# Patient Record
Sex: Male | Born: 1965 | Hispanic: Yes | Marital: Married | Smoking: Never smoker
Health system: Southern US, Community
[De-identification: ages and names within clinical notes are randomized; demographics above are authoritative.]

---

## 2018-12-22 ENCOUNTER — Other Ambulatory Visit: Payer: Self-pay

## 2018-12-22 ENCOUNTER — Encounter: Payer: Self-pay | Admitting: Emergency Medicine

## 2018-12-22 ENCOUNTER — Emergency Department
Admission: EM | Admit: 2018-12-22 | Discharge: 2018-12-23 | Disposition: A | Discharge status: Home / Self Care | Payer: HRSA Program | Attending: Emergency Medicine | Admitting: Emergency Medicine

## 2018-12-22 ENCOUNTER — Emergency Department: Payer: HRSA Program

## 2018-12-22 DIAGNOSIS — R509 Fever, unspecified: Secondary | ICD-10-CM | POA: Diagnosis present

## 2018-12-22 NOTE — ED Triage Notes (Addendum)
Patient ambulatory to triage with steady gait, without difficulty or distress noted, mask in place; Pt reports since Tuesday having chills, 99.1 temp yesterday at work; recent exposure to COVID after mowing a yard last wk; occas cough, recent sore throat

## 2018-12-22 NOTE — ED Notes (Signed)
Patient c/o light-headedness when moving head quickly.

## 2018-12-23 LAB — SARS CORONAVIRUS 2 BY RT PCR (HOSPITAL ORDER, PERFORMED IN ~~LOC~~ HOSPITAL LAB): SARS Coronavirus 2: POSITIVE — AB

## 2018-12-23 MED ORDER — AZITHROMYCIN 500 MG PO TABS: 500.0000 mg | ORAL_TABLET | Freq: Every day | ORAL | 0 refills | Status: AC

## 2018-12-23 NOTE — Discharge Instructions (Signed)
Person Under Monitoring Name: Brian Wilkinson  Location: [address] [place] [address]   Infection Prevention Recommendations for Individuals Confirmed to have, or Being Evaluated for, 2019 Novel Coronavirus (COVID-19) Infection Who Receive Care at Home  Individuals who are confirmed to have, or are being evaluated for, COVID-19 should follow the prevention steps below until a healthcare provider or local or state health department says they can return to normal activities.  Stay home except to get medical care You should restrict activities outside your home, except for getting medical care. Do not go to work, school, or public areas, and do not use public transportation or taxis.  Call ahead before visiting your doctor Before your medical appointment, call the healthcare provider and tell them that you have, or are being evaluated for, COVID-19 infection. This will help the healthcare providers office take steps to keep other people from getting infected. Ask your healthcare provider to call the local or state health department.  Monitor your symptoms Seek prompt medical attention if your illness is worsening (e.g., difficulty breathing). Before going to your medical appointment, call the healthcare provider and tell them that you have, or are being evaluated for, COVID-19 infection. Ask your healthcare provider to call the local or state health department.  Wear a facemask You should wear a facemask that covers your nose and mouth when you are in the same room with other people and when you visit a healthcare provider. People who live with or visit you should also wear a facemask while they are in the same room with you.  Separate yourself from other people in your home As much as possible, you should stay in a different room from other people in your home. Also, you should use a separate bathroom, if available.  Avoid sharing household items You should not  share dishes, drinking glasses, cups, eating utensils, towels, bedding, or other items with other people in your home. After using these items, you should wash them thoroughly with soap and water.  Cover your coughs and sneezes Cover your mouth and nose with a tissue when you cough or sneeze, or you can cough or sneeze into your sleeve. Throw used tissues in a lined trash can, and immediately wash your hands with soap and water for at least 20 seconds or use an alcohol-based hand rub.  Wash your [employer] your hands often and thoroughly with soap and water for at least 20 seconds. You can use an alcohol-based hand sanitizer if soap and water are not available and if your hands are not visibly dirty. Avoid touching your eyes, nose, and mouth with unwashed hands.   Prevention Steps for Caregivers and Household Members of Individuals Confirmed to have, or Being Evaluated for, COVID-19 Infection Being Cared for in the Home  If you live with, or provide care at home for, a person confirmed to have, or being evaluated for, COVID-19 infection please follow these guidelines to prevent infection:  Follow healthcare providers instructions Make sure that you understand and can help the patient follow any healthcare provider instructions for all care.  Provide for the patients basic needs You should help the patient with basic needs in the home and provide support for getting groceries, prescriptions, and other personal needs.  Monitor the patients symptoms If they are getting sicker, call his or her medical provider and tell them that the patient has, or is being evaluated for, COVID-19 infection. This will help the healthcare providers office  take steps to keep other people from getting infected. Ask the healthcare provider to call the local or state health department.  Limit the number of people who have contact with the patient If possible, have only one caregiver for the  patient. Other household members should stay in another home or place of residence. If this is not possible, they should stay in another room, or be separated from the patient as much as possible. Use a separate bathroom, if available. Restrict visitors who do not have an essential need to be in the home.  Keep older adults, very young children, and other sick people away from the patient Keep older adults, very young children, and those who have compromised immune systems or chronic health conditions away from the patient. This includes people with chronic heart, lung, or kidney conditions, diabetes, and cancer.  Ensure good ventilation Make sure that shared spaces in the home have good air flow, such as from an air conditioner or an opened window, weather permitting.  Wash your hands often Wash your hands often and thoroughly with soap and water for at least 20 seconds. You can use an alcohol based hand sanitizer if soap and water are not available and if your hands are not visibly dirty. Avoid touching your eyes, nose, and mouth with unwashed hands. Use disposable paper towels to dry your hands. If not available, use dedicated cloth towels and replace them when they become wet.  Wear a facemask and gloves Wear a disposable facemask at all times in the room and gloves when you touch or have contact with the patients blood, body fluids, and/or secretions or excretions, such as sweat, saliva, sputum, nasal mucus, vomit, urine, or feces.  Ensure the mask fits over your nose and mouth tightly, and do not touch it during use. Throw out disposable facemasks and gloves after using them. Do not reuse. Wash your hands immediately after removing your facemask and gloves. If your personal clothing becomes contaminated, carefully remove clothing and launder. Wash your hands after handling contaminated clothing. Place all used disposable facemasks, gloves, and other waste in a lined container before  disposing them with other household waste. Remove gloves and wash your hands immediately after handling these items.  Do not share dishes, glasses, or other household items with the patient Avoid sharing household items. You should not share dishes, drinking glasses, cups, eating utensils, towels, bedding, or other items with a patient who is confirmed to have, or being evaluated for, COVID-19 infection. After the person uses these items, you should wash them thoroughly with soap and water.  Wash laundry thoroughly Immediately remove and wash clothes or bedding that have blood, body fluids, and/or secretions or excretions, such as sweat, saliva, sputum, nasal mucus, vomit, urine, or feces, on them. Wear gloves when handling laundry from the patient. Read and follow directions on labels of laundry or clothing items and detergent. In general, wash and dry with the warmest temperatures recommended on the label.  Clean all areas the individual has used often Clean all touchable surfaces, such as counters, tabletops, doorknobs, bathroom fixtures, toilets, phones, keyboards, tablets, and bedside tables, every day. Also, clean any surfaces that may have blood, body fluids, and/or secretions or excretions on them. Wear gloves when cleaning surfaces the patient has come in contact with. Use a diluted bleach solution (e.g., dilute bleach with 1 part bleach and 10 parts water) or a household disinfectant with a label that says EPA-registered for coronaviruses. To make a bleach  solution at home, add 1 tablespoon of bleach to 1 quart (4 cups) of water. For a larger supply, add  cup of bleach to 1 gallon (16 cups) of water. Read labels of cleaning products and follow recommendations provided on product labels. Labels contain instructions for safe and effective use of the cleaning product including precautions you should take when applying the product, such as wearing gloves or eye protection and making sure you  have good ventilation during use of the product. Remove gloves and wash hands immediately after cleaning.  Monitor yourself for signs and symptoms of illness Caregivers and household members are considered close contacts, should monitor their health, and will be asked to limit movement outside of the home to the extent possible. Follow the monitoring steps for close contacts listed on the symptom monitoring form.   ? If you have additional questions, contact your local health department or call the epidemiologist on call at [phone] (available 24/7). ? This guidance is subject to change. For the most up-to-date guidance from [facility], please refer to their website: [email or web address]

## 2018-12-23 NOTE — ED Provider Notes (Signed)
[facility] Emergency Department Provider Note    First MD Initiated Contact with Patient 12/22/18 2343     (approximate)  I have reviewed the triage vital signs and the nursing notes.   HISTORY  Chief Complaint Fever    HPI Brian Wilkinson is a 53 y.o. male with medical history as listed below presents to the emergency department with concern for possible COVID-19 infection.  Patient states that he was in los contact with someone  who tested positive for COVID-19 7 days ago.  Patient states beginning 2 days ago he started having fever chills dry cough congestion.        History reviewed. No pertinent past medical history.  There are no active problems to display for this patient.   History reviewed. No pertinent surgical history.  Prior to Admission medications   Not on File    Allergies Patient has no known allergies.  No family history on file.  Social History Social History   Tobacco Use   Smoking status: Not on file  Substance Use Topics   Alcohol use: Not on file   Drug use: Not on file    Review of Systems Constitutional: No fever/chills Eyes: No visual changes. ENT: No sore throat. Cardiovascular: Denies chest pain. Respiratory: Denies shortness of breath. Gastrointestinal: No abdominal pain.  No nausea, no vomiting.  No diarrhea.  No constipation. Genitourinary: Negative for dysuria. Musculoskeletal: Negative for neck pain.  Negative for back pain. Integumentary: Negative for rash. Neurological: Negative for headaches, focal weakness or numbness.   ____________________________________________   PHYSICAL EXAM:  VITAL SIGNS: ED Triage Vitals  Enc Vitals Group     BP 12/22/18 2213 (!) 148/95     Pulse Rate 12/22/18 2213 97     Resp 12/22/18 2213 19     Temp 12/22/18 2213 100.1 F (37.8 C)     Temp src --      SpO2 12/22/18 2213 95 %     Weight 12/22/18 2211 73.5 kg (162 lb)     Height 12/22/18 2211 1.626 m  (5\' 4" )     Head Circumference --      Peak Flow --      Pain Score 12/22/18 2214 0     Pain Loc --      Pain Edu? --      Excl. in GC? --     Constitutional: Alert and oriented. Well appearing and in no acute distress. Eyes: Conjunctivae are normal.  Head: Atraumatic. Mouth/Throat: Mucous membranes are moist.  Oropharynx non-erythematous. Neck: No stridor.   Cardiovascular: Normal rate, regular rhythm. Good peripheral circulation. Grossly normal heart sounds. Respiratory: Normal respiratory effort.  No retractions. No audible wheezing. Gastrointestinal: Soft and nontender. No distention.  Musculoskeletal: No lower extremity tenderness nor edema. No gross deformities of extremities. Neurologic:  Normal speech and language. No gross focal neurologic deficits are appreciated.  Skin:  Skin is warm, dry and intact. No rash noted. Psychiatric: Mood and affect are normal. Speech and behavior are normal.  ____________________________________________   LABS (all labs ordered are listed, but only abnormal results are displayed)  Labs Reviewed  SARS CORONAVIRUS 2 (HOSPITAL ORDER, PERFORMED IN [facility]) - Abnormal; Notable for the following components:      Result Value   SARS Coronavirus 2 POSITIVE (*)    All other components within normal limits   _  RADIOLOGY I, Bryant N Hudson Lehmkuhl, personally viewed and evaluated these images (plain radiographs)  as part of my medical decision making, as well as reviewing the written report by the radiologist.  ED MD interpretation: Left base scarring or atelectasis on chest x-ray interpretation per radiologist.  Official radiology report(s): Dg Chest Port 1 View  Result Date: 12/22/2018 CLINICAL DATA:  Chills, cough EXAM: PORTABLE CHEST 1 VIEW COMPARISON:  None FINDINGS: Linear atelectasis or scarring at the left base. No confluent opacity on the right. Heart is upper limits normal in size. No effusions or acute bony abnormality.  IMPRESSION: Left base scarring or atelectasis. Electronically Signed   By: Charlett Nose M.D.   On: 12/22/2018 22:35      Procedures   ____________________________________________   INITIAL IMPRESSION / MDM / ASSESSMENT AND PLAN / ED COURSE  As part of my medical decision making, I reviewed the following data within the electronic MEDICAL RECORD NUMBER   53 year old male presenting with above-stated history and physical exam concerning for possible COVID-19 infection and as such appropriate testing was performed.  Patient's COVID-19 test positive.  Spoke with the patient at length regarding this clinical finding and the necessity of home isolation.  I also informed the patient that if his family members or anyone that he has been in contact with from symptomatic that they would need to seek medical attention.  Brian Wilkinson was evaluated in Emergency Department on 12/23/2018 for the symptoms described in the history of present illness. He was evaluated in the context of the global COVID-19 pandemic, which necessitated consideration that the patient might be at risk for infection with the SARS-CoV-2 virus that causes COVID-19. Institutional protocols and algorithms that pertain to the evaluation of patients at risk for COVID-19 are in a state of rapid change based on information released by regulatory bodies including the CDC and federal and state organizations. These policies and algorithms were followed during the patient's care in the ED.       ____________________________________________  FINAL CLINICAL IMPRESSION(S) / ED DIAGNOSES  Final diagnoses:  COVID-19 virus infection     MEDICATIONS GIVEN DURING THIS VISIT:  Medications - No data to display   ED Discharge Orders    None       Note:  This document was prepared using Dragon voice recognition software and may include unintentional dictation errors.   Darci Current, MD 12/23/18 [phone]

## 2018-12-30 ENCOUNTER — Encounter (HOSPITAL_COMMUNITY): Payer: Self-pay | Admitting: Family Medicine

## 2018-12-30 ENCOUNTER — Emergency Department: Payer: HRSA Program

## 2018-12-30 ENCOUNTER — Other Ambulatory Visit: Payer: Self-pay

## 2018-12-30 ENCOUNTER — Emergency Department
Admission: EM | Admit: 2018-12-30 | Discharge: 2018-12-30 | Disposition: A | Discharge status: Other Acute Inpatient Hospital | Payer: HRSA Program | Attending: Emergency Medicine | Admitting: Emergency Medicine

## 2018-12-30 ENCOUNTER — Encounter: Payer: Self-pay | Admitting: Emergency Medicine

## 2018-12-30 ENCOUNTER — Inpatient Hospital Stay (HOSPITAL_COMMUNITY)
Admission: AD | Admit: 2018-12-30 | Discharge: 2019-01-04 | DRG: 208 | Disposition: A | Discharge status: Home / Self Care | Payer: HRSA Program | Source: Other Acute Inpatient Hospital | Attending: Internal Medicine | Admitting: Internal Medicine

## 2018-12-30 DIAGNOSIS — U071 COVID-19: Secondary | ICD-10-CM | POA: Diagnosis present

## 2018-12-30 DIAGNOSIS — J069 Acute upper respiratory infection, unspecified: Secondary | ICD-10-CM | POA: Diagnosis not present

## 2018-12-30 DIAGNOSIS — I10 Essential (primary) hypertension: Secondary | ICD-10-CM | POA: Diagnosis present

## 2018-12-30 DIAGNOSIS — R0602 Shortness of breath: Secondary | ICD-10-CM | POA: Diagnosis present

## 2018-12-30 DIAGNOSIS — J9601 Acute respiratory failure with hypoxia: Secondary | ICD-10-CM | POA: Insufficient documentation

## 2018-12-30 DIAGNOSIS — J80 Acute respiratory distress syndrome: Secondary | ICD-10-CM | POA: Diagnosis present

## 2018-12-30 DIAGNOSIS — J988 Other specified respiratory disorders: Secondary | ICD-10-CM

## 2018-12-30 DIAGNOSIS — J189 Pneumonia, unspecified organism: Secondary | ICD-10-CM | POA: Diagnosis not present

## 2018-12-30 DIAGNOSIS — R001 Bradycardia, unspecified: Secondary | ICD-10-CM | POA: Diagnosis present

## 2018-12-30 DIAGNOSIS — R0682 Tachypnea, not elsewhere classified: Secondary | ICD-10-CM | POA: Diagnosis present

## 2018-12-30 DIAGNOSIS — R7989 Other specified abnormal findings of blood chemistry: Secondary | ICD-10-CM | POA: Diagnosis not present

## 2018-12-30 DIAGNOSIS — I248 Other forms of acute ischemic heart disease: Secondary | ICD-10-CM | POA: Diagnosis present

## 2018-12-30 DIAGNOSIS — J1289 Other viral pneumonia: Secondary | ICD-10-CM | POA: Diagnosis present

## 2018-12-30 LAB — BLOOD GAS, VENOUS
Acid-Base Excess: 0.3 mmol/L (ref 0.0–2.0)
Bicarbonate: 27.3 mmol/L (ref 20.0–28.0)
FIO2: 1
MECHVT: 450 mL
O2 Saturation: 95.5 %
PEEP: 5 cmH2O
Patient temperature: 37
RATE: 16 resp/min
pCO2, Ven: 53 mmHg (ref 44.0–60.0)
pH, Ven: 7.32 (ref 7.250–7.430)
pO2, Ven: 85 mmHg — ABNORMAL HIGH (ref 32.0–45.0)

## 2018-12-30 LAB — CBC WITH DIFFERENTIAL/PLATELET
Abs Immature Granulocytes: 0.01 10*3/uL (ref 0.00–0.07)
Basophils Absolute: 0 10*3/uL (ref 0.0–0.1)
Basophils Relative: 0 %
Eosinophils Absolute: 0 10*3/uL (ref 0.0–0.5)
Eosinophils Relative: 0 %
HCT: 37.5 % — ABNORMAL LOW (ref 39.0–52.0)
Hemoglobin: 12.6 g/dL — ABNORMAL LOW (ref 13.0–17.0)
Immature Granulocytes: 0 %
Lymphocytes Relative: 18 %
Lymphs Abs: 0.7 10*3/uL (ref 0.7–4.0)
MCH: 31.8 pg (ref 26.0–34.0)
MCHC: 33.6 g/dL (ref 30.0–36.0)
MCV: 94.7 fL (ref 80.0–100.0)
Monocytes Absolute: 0.2 10*3/uL (ref 0.1–1.0)
Monocytes Relative: 7 %
Neutro Abs: 2.7 10*3/uL (ref 1.7–7.7)
Neutrophils Relative %: 75 %
Platelets: 152 10*3/uL (ref 150–400)
RBC: 3.96 MIL/uL — ABNORMAL LOW (ref 4.22–5.81)
RDW: 11.6 % (ref 11.5–15.5)
WBC: 3.7 10*3/uL — ABNORMAL LOW (ref 4.0–10.5)
nRBC: 0 % (ref 0.0–0.2)

## 2018-12-30 LAB — PROCALCITONIN: Procalcitonin: <0.1 ng/mL

## 2018-12-30 LAB — COMPREHENSIVE METABOLIC PANEL
ALT: 19 U/L (ref 0–44)
AST: 31 U/L (ref 15–41)
Albumin: 3.4 g/dL — ABNORMAL LOW (ref 3.5–5.0)
Alkaline Phosphatase: 59 U/L (ref 38–126)
Anion gap: 12 (ref 5–15)
BUN: 14 mg/dL (ref 6–20)
CO2: 25 mmol/L (ref 22–32)
Calcium: 8 mg/dL — ABNORMAL LOW (ref 8.9–10.3)
Chloride: 97 mmol/L — ABNORMAL LOW (ref 98–111)
Creatinine, Ser: 0.74 mg/dL (ref 0.61–1.24)
GFR calc Af Amer: >60 mL/min (ref >60)
GFR calc non Af Amer: >60 mL/min (ref >60)
Glucose, Bld: 133 mg/dL — ABNORMAL HIGH (ref 70–99)
Potassium: 3.8 mmol/L (ref 3.5–5.1)
Sodium: 134 mmol/L — ABNORMAL LOW (ref 135–145)
Total Bilirubin: 0.9 mg/dL (ref 0.3–1.2)
Total Protein: 7.6 g/dL (ref 6.5–8.1)

## 2018-12-30 LAB — C-REACTIVE PROTEIN: CRP: 19.1 mg/dL — ABNORMAL HIGH (ref <1.0)

## 2018-12-30 LAB — POCT I-STAT 7, (LYTES, BLD GAS, ICA,H+H)
Acid-Base Excess: 2 mmol/L (ref 0.0–2.0)
Bicarbonate: 27.1 mmol/L (ref 20.0–28.0)
Calcium, Ion: 1.15 mmol/L (ref 1.15–1.40)
HCT: 27 % — ABNORMAL LOW (ref 39.0–52.0)
Hemoglobin: 9.2 g/dL — ABNORMAL LOW (ref 13.0–17.0)
O2 Saturation: 100 %
Patient temperature: 36.5
Potassium: 4.2 mmol/L (ref 3.5–5.1)
Sodium: 137 mmol/L (ref 135–145)
TCO2: 28 mmol/L (ref 22–32)
pCO2 arterial: 44.5 mmHg (ref 32.0–48.0)
pH, Arterial: 7.391 (ref 7.350–7.450)
pO2, Arterial: 290 mmHg — ABNORMAL HIGH (ref 83.0–108.0)

## 2018-12-30 LAB — FIBRINOGEN: Fibrinogen: >750 mg/dL — ABNORMAL HIGH (ref 210–475)

## 2018-12-30 LAB — GLUCOSE, CAPILLARY
Glucose-Capillary: 81 mg/dL (ref 70–99)
Glucose-Capillary: 123 mg/dL — ABNORMAL HIGH (ref 70–99)
Glucose-Capillary: 67 mg/dL — ABNORMAL LOW (ref 70–99)

## 2018-12-30 LAB — MRSA PCR SCREENING: MRSA by PCR: NEGATIVE

## 2018-12-30 LAB — FIBRIN DERIVATIVES D-DIMER (ARMC ONLY): Fibrin derivatives D-dimer (ARMC): 1047.22 ng/mL (FEU) — ABNORMAL HIGH (ref 0.00–499.00)

## 2018-12-30 LAB — ABO/RH: ABO/RH(D): O POS

## 2018-12-30 LAB — LACTIC ACID, PLASMA: Lactic Acid, Venous: 1.2 mmol/L (ref 0.5–1.9)

## 2018-12-30 LAB — TRIGLYCERIDES: Triglycerides: 211 mg/dL — ABNORMAL HIGH (ref <150)

## 2018-12-30 LAB — LACTATE DEHYDROGENASE: LDH: 239 U/L — ABNORMAL HIGH (ref 98–192)

## 2018-12-30 LAB — FERRITIN: Ferritin: 1306 ng/mL — ABNORMAL HIGH (ref 24–336)

## 2018-12-30 LAB — MAGNESIUM: Magnesium: 2.4 mg/dL (ref 1.7–2.4)

## 2018-12-30 LAB — PHOSPHORUS: Phosphorus: 4 mg/dL (ref 2.5–4.6)

## 2018-12-30 LAB — TROPONIN I: Troponin I: 0.37 ng/mL (ref <0.03)

## 2018-12-30 MED ORDER — MIDAZOLAM 50MG/50ML (1MG/ML) PREMIX INFUSION
2.0000 mg/h | INTRAVENOUS | Status: DC
  Administered 2018-12-30: 7 mg/h via INTRAVENOUS
  Filled 2018-12-30: qty 50

## 2018-12-30 MED ORDER — ARTIFICIAL TEARS OPHTHALMIC OINT
1.0000 "application " | TOPICAL_OINTMENT | Freq: Three times a day (TID) | OPHTHALMIC | Status: DC
  Filled 2018-12-30: qty 3.5

## 2018-12-30 MED ORDER — ORAL CARE MOUTH RINSE
15.0000 mL | OROMUCOSAL | Status: DC
  Administered 2018-12-30 – 2018-12-31 (×8): 15 mL via OROMUCOSAL

## 2018-12-30 MED ORDER — MIDAZOLAM HCL 2 MG/2ML IJ SOLN: 2.0000 mg | INTRAMUSCULAR | Status: DC | PRN

## 2018-12-30 MED ORDER — ROCURONIUM BROMIDE 50 MG/5ML IV SOLN
INTRAVENOUS | Status: AC | PRN
  Administered 2018-12-30: 10 mg via INTRAVENOUS

## 2018-12-30 MED ORDER — SODIUM CHLORIDE 0.9 % IV SOLN
2.0000 mg/h | INTRAVENOUS | Status: DC
  Administered 2018-12-30: 7 mg/h via INTRAVENOUS
  Filled 2018-12-30 (×2): qty 10

## 2018-12-30 MED ORDER — FENTANYL 2500MCG IN NS 250ML (10MCG/ML) PREMIX INFUSION: 0.0000 ug/h | INTRAVENOUS | Status: DC

## 2018-12-30 MED ORDER — ROCURONIUM BROMIDE 50 MG/5ML IV SOLN
INTRAVENOUS | Status: AC | PRN
  Administered 2018-12-30 (×2): 10 mg via INTRAVENOUS

## 2018-12-30 MED ORDER — ASPIRIN 81 MG PO CHEW
325.0000 mg | CHEWABLE_TABLET | Freq: Every day | ORAL | Status: DC
  Administered 2018-12-30: 324 mg
  Administered 2018-12-31 – 2019-01-01 (×2): 325 mg
  Filled 2018-12-30 (×3): qty 5

## 2018-12-30 MED ORDER — DOCUSATE SODIUM 50 MG/5ML PO LIQD: 100.0000 mg | Freq: Two times a day (BID) | ORAL | Status: DC | PRN

## 2018-12-30 MED ORDER — MIDAZOLAM BOLUS VIA INFUSION
1.0000 mg | INTRAVENOUS | Status: DC | PRN
  Filled 2018-12-30: qty 2

## 2018-12-30 MED ORDER — SUCCINYLCHOLINE CHLORIDE 20 MG/ML IJ SOLN
INTRAMUSCULAR | Status: AC | PRN
  Administered 2018-12-30: 120 mg via INTRAVENOUS

## 2018-12-30 MED ORDER — FENTANYL 2500MCG IN NS 250ML (10MCG/ML) PREMIX INFUSION
50.0000 ug/h | INTRAVENOUS | Status: DC
  Administered 2018-12-30: 13:00:00 50 ug/h via INTRAVENOUS

## 2018-12-30 MED ORDER — FAMOTIDINE IN NACL 20-0.9 MG/50ML-% IV SOLN
20.0000 mg | Freq: Two times a day (BID) | INTRAVENOUS | Status: DC
  Administered 2018-12-30 – 2019-01-01 (×5): 20 mg via INTRAVENOUS
  Filled 2018-12-30 (×5): qty 50

## 2018-12-30 MED ORDER — DEXTROSE 50 % IV SOLN
INTRAVENOUS | Status: AC
  Administered 2018-12-30: 12.5 g via INTRAVENOUS
  Filled 2018-12-30: qty 50

## 2018-12-30 MED ORDER — ENOXAPARIN SODIUM 40 MG/0.4ML ~~LOC~~ SOLN
40.0000 mg | Freq: Every day | SUBCUTANEOUS | Status: DC
  Administered 2018-12-30 – 2019-01-04 (×6): 40 mg via SUBCUTANEOUS
  Filled 2018-12-30 (×6): qty 0.4

## 2018-12-30 MED ORDER — PRO-STAT SUGAR FREE PO LIQD
30.0000 mL | Freq: Two times a day (BID) | ORAL | Status: DC
  Administered 2018-12-30: 30 mL
  Filled 2018-12-30: qty 30

## 2018-12-30 MED ORDER — CHLORHEXIDINE GLUCONATE 0.12% ORAL RINSE (MEDLINE KIT): 15.0000 mL | Freq: Two times a day (BID) | OROMUCOSAL | Status: DC

## 2018-12-30 MED ORDER — FENTANYL BOLUS VIA INFUSION
50.0000 ug | INTRAVENOUS | Status: DC | PRN
  Filled 2018-12-30: qty 50

## 2018-12-30 MED ORDER — PROPOFOL 1000 MG/100ML IV EMUL
INTRAVENOUS | Status: AC
  Filled 2018-12-30: qty 100

## 2018-12-30 MED ORDER — ETOMIDATE 2 MG/ML IV SOLN
INTRAVENOUS | Status: AC | PRN
  Administered 2018-12-30: 20 mg via INTRAVENOUS

## 2018-12-30 MED ORDER — ORAL CARE MOUTH RINSE: 15.0000 mL | OROMUCOSAL | Status: DC

## 2018-12-30 MED ORDER — FENTANYL CITRATE (PF) 100 MCG/2ML IJ SOLN: 50.0000 ug | Freq: Once | INTRAMUSCULAR | Status: DC

## 2018-12-30 MED ORDER — FENTANYL 2500MCG IN NS 250ML (10MCG/ML) PREMIX INFUSION
50.0000 ug/h | INTRAVENOUS | Status: DC
  Administered 2018-12-30: 50 ug/h via INTRAVENOUS

## 2018-12-30 MED ORDER — MIDAZOLAM HCL 5 MG/5ML IJ SOLN
INTRAMUSCULAR | Status: AC | PRN
  Administered 2018-12-30: 4 mg via INTRAVENOUS

## 2018-12-30 MED ORDER — FENTANYL 2500MCG IN NS 250ML (10MCG/ML) PREMIX INFUSION
INTRAVENOUS | Status: AC
  Filled 2018-12-30: qty 250

## 2018-12-30 MED ORDER — SODIUM CHLORIDE 0.9 % IV BOLUS
1000.0000 mL | Freq: Once | INTRAVENOUS | Status: AC
  Administered 2018-12-30: 1000 mL via INTRAVENOUS

## 2018-12-30 MED ORDER — PROPOFOL 500 MG/50ML IV EMUL
INTRAVENOUS | Status: AC | PRN
  Administered 2018-12-30: 10 ug/kg/min via INTRAVENOUS

## 2018-12-30 MED ORDER — CHLORHEXIDINE GLUCONATE 0.12% ORAL RINSE (MEDLINE KIT)
15.0000 mL | Freq: Two times a day (BID) | OROMUCOSAL | Status: DC
  Administered 2018-12-30 – 2019-01-04 (×9): 15 mL via OROMUCOSAL

## 2018-12-30 MED ORDER — SODIUM CHLORIDE 0.9 % IV SOLN
INTRAVENOUS | Status: DC | PRN
  Administered 2018-12-30: 13:00:00 1000 mL via INTRAVENOUS
  Administered 2019-01-01: 250 mL via INTRAVENOUS

## 2018-12-30 MED ORDER — DEXTROSE 50 % IV SOLN
12.5000 g | Freq: Once | INTRAVENOUS | Status: AC
  Administered 2018-12-30: 12.5 g via INTRAVENOUS

## 2018-12-30 MED ORDER — FUROSEMIDE 10 MG/ML IJ SOLN
40.0000 mg | Freq: Once | INTRAMUSCULAR | Status: AC
  Administered 2018-12-30: 40 mg via INTRAVENOUS
  Filled 2018-12-30: qty 4

## 2018-12-30 MED ORDER — FENTANYL BOLUS VIA INFUSION
50.0000 ug | INTRAVENOUS | Status: DC | PRN
  Administered 2018-12-30: 50 ug via INTRAVENOUS
  Filled 2018-12-30: qty 50

## 2018-12-30 MED ORDER — SODIUM CHLORIDE 0.9 % IV SOLN
0.5000 mg/h | INTRAVENOUS | Status: DC
  Filled 2018-12-30: qty 10

## 2018-12-30 MED ORDER — VITAL HIGH PROTEIN PO LIQD
1000.0000 mL | ORAL | Status: DC
  Administered 2018-12-30: 1000 mL

## 2018-12-30 MED ORDER — TOCILIZUMAB 400 MG/20ML IV SOLN
600.0000 mg | Freq: Once | INTRAVENOUS | Status: AC
  Administered 2018-12-30: 600 mg via INTRAVENOUS
  Filled 2018-12-30: qty 20

## 2018-12-30 NOTE — Progress Notes (Signed)
Initial Nutrition Assessment RD working remotely.  DOCUMENTATION CODES:   Not applicable  INTERVENTION:    Recommend begin TF via OGT: Vital AF 1.2 at 60 ml/h (1440 ml per day)  Pro-stat 30 ml once daily  Provides 1828 kcal, 123 gm protein, 1168 ml free water daily  NUTRITION DIAGNOSIS:   Inadequate oral intake related to inability to eat as evidenced by NPO status.  GOAL:   Patient will meet greater than or equal to 90% of their needs  MONITOR:   Vent status, Labs, Skin, I & O's  REASON FOR ASSESSMENT:   Ventilator    ASSESSMENT:   53 yo male who was diagnosed with COVID-19 on 4/24. He returned to the ED with progressive respiratory distress r/t bilateral PNA requiring intubation on admission.  Patient is on ARDS protocol. Patient is currently intubated on ventilator support. OGT in place. MV: 7.2 L/min Temp (24hrs), Avg:98.3 F (36.8 C), Min:97.4 F (36.3 C), Max:99.9 F (37.7 C)   Labs and medications reviewed.   NUTRITION - FOCUSED PHYSICAL EXAM:  uanble to complete-working remotely  Diet Order:   Diet Order            Diet NPO time specified  Diet effective now              EDUCATION NEEDS:   No education needs have been identified at this time  Skin:  Skin Assessment: Reviewed RN Assessment  Last BM:  no BM documented  Height:   Ht Readings from Last 1 Encounters:  12/30/18 5\' 4"  (1.626 m)    Weight:   Wt Readings from Last 1 Encounters:  12/30/18 73.5 kg    Ideal Body Weight:  59.1 kg  BMI:  Body mass index is 27.81 kg/m.  Estimated Nutritional Needs:   Kcal:  1750  Protein:  110-125 gm  Fluid:  1.8 L    Joaquin Courts, RD, LDN, CNSC Pager [phone] After Hours Pager [phone]

## 2018-12-30 NOTE — Progress Notes (Addendum)
Hypoglycemic Event  CBG: 67  Treatment: 12.5 grams D50  Symptoms: None  Follow-up CBG: Time:1600 CBG Result: 123  Possible Reasons for Event: NPO  Comments/MD notified: Corrected     Rosana Fret

## 2018-12-30 NOTE — ED Provider Notes (Signed)
[facility] Emergency Department Provider Note ___________________   None    (approximate)  I have reviewed the triage vital signs and the nursing notes.  Level 5 caveat history and system limited secondary to respiratory distress  HISTORY  Chief Complaint Respiratory Distress    HPI Brian Wilkinson is a 53 y.o. male recently diagnosed with COVID-19 on 12/23/2018 returns to the emergency department via EMS in respiratory distress.  Patient admits to progressive difficulty breathing this evening.   EMS states on their arrival patient's oxygen saturation 84% on room air current oxygen saturation 92% on a nonrebreather.  Patient is tachypneic with accessory respiratory muscle use speaking 3 word phrases       Past medical history COVID-19 12/23/2018 There are no active problems to display for this patient.   History reviewed. No pertinent surgical history.  Prior to Admission medications   Not on File    Allergies No known drug allergies History reviewed. No pertinent family history.  Social History Social History   Tobacco Use   Smoking status: Not on file  Substance Use Topics   Alcohol use: Not on file   Drug use: Not on file    Review of Systems Constitutional: No fever/chills Eyes: No visual changes. ENT: No sore throat. Cardiovascular: Denies chest pain. Respiratory: Positive for cough and shortness of breath. Gastrointestinal: No abdominal pain.  No nausea, no vomiting.  No diarrhea.  No constipation. Genitourinary: Negative for dysuria. Musculoskeletal: Negative for neck pain.  Negative for back pain. Integumentary: Negative for rash. Neurological: Negative for headaches, focal weakness or numbness.   ____________________________________________   PHYSICAL EXAM:  VITAL SIGNS: ED Triage Vitals [12/30/18 0455]  Enc Vitals Group     BP      Pulse      Resp      Temp      Temp src      SpO2 100 %     Weight    Height      Head Circumference      Peak Flow      Pain Score      Pain Loc      Pain Edu?      Excl. in [place]?     Constitutional: Alert with apparent respiratory distress Eyes: Conjunctivae are normal. Mouth/Throat: Mucous membranes are moist. Oropharynx non-erythematous. Neck: No stridor.   Cardiovascular: Tachycardia, regular rhythm. Good peripheral circulation. Grossly normal heart sounds. Respiratory: Tachypnea, positive accessory respiratory muscle use Gastrointestinal: Soft and nontender. No distention.  Musculoskeletal: No lower extremity tenderness nor edema. No gross deformities of extremities. Neurologic:  No gross focal neurologic deficits are appreciated.  Skin:  Skin is warm, dry and intact. No rash noted. Psychiatric: Mood and affect are normal. Speech and behavior are normal.  ____________________________________________   LABS (all labs ordered are listed, but only abnormal results are displayed)  Labs Reviewed  BLOOD GAS, VENOUS - Abnormal; Notable for the following components:      Result Value   pO2, Ven 85.0 (*)    All other components within normal limits  CBC WITH DIFFERENTIAL/PLATELET - Abnormal; Notable for the following components:   WBC 3.7 (*)    RBC 3.96 (*)    Hemoglobin 12.6 (*)    HCT 37.5 (*)    All other components within normal limits  COMPREHENSIVE METABOLIC PANEL - Abnormal; Notable for the following components:   Sodium 134 (*)    Chloride 97 (*)  Glucose, Bld 133 (*)    Calcium 8.0 (*)    Albumin 3.4 (*)    All other components within normal limits  FIBRIN DERIVATIVES D-DIMER ([facility] ONLY) - Abnormal; Notable for the following components:   Fibrin derivatives D-dimer [facility]) 1,047.22 (*)    All other components within normal limits  LACTATE DEHYDROGENASE - Abnormal; Notable for the following components:   LDH 239 (*)    All other components within normal limits  FERRITIN - Abnormal; Notable for the following components:   Ferritin  1,306 (*)    All other components within normal limits  TRIGLYCERIDES - Abnormal; Notable for the following components:   Triglycerides 211 (*)    All other components within normal limits  FIBRINOGEN - Abnormal; Notable for the following components:   Fibrinogen >750 (*)    All other components within normal limits  CULTURE, BLOOD (ROUTINE X 2)  CULTURE, BLOOD (ROUTINE X 2)  LACTIC ACID, PLASMA  PROCALCITONIN  C-REACTIVE PROTEIN   ____________________________________________ _  RADIOLOGY I, Franklin N Aikeem Lilley, personally viewed and evaluated these images (plain radiographs) as part of my medical decision making, as well as reviewing the written report by the radiologist.  ED MD interpretation: Bilateral pneumonia noted on chest x-ray per radiologist.  Official radiology report(s): Dg Chest Port 1 View  Result Date: 12/30/2018 CLINICAL DATA:  Respiratory distress EXAM: PORTABLE CHEST 1 VIEW COMPARISON:  12/22/2018 FINDINGS: Endotracheal tube with tip 1.5 cm above the carina. The orogastric tube tip is in the gastric fundus. Low volume chest with streaky and hazy opacity on both sides. No Kerley lines, effusion, or pneumothorax. Normal heart size. IMPRESSION: 1. Endotracheal tube with tip 15 mm above the carina. 2. The orogastric tube tip is in the stomach. 3. Positive COVID test with bilateral pneumonia. Electronically Signed   By: Monte Fantasia M.D.   On: 12/30/2018 06:06    ____________________________________________   .Critical Care Performed by: Gregor Hams, MD Authorized by: Gregor Hams, MD   Critical care provider statement:    Critical care time (minutes):  8 (COVID 19)   Critical care time was exclusive of:  Separately billable procedures and treating other patients and teaching time   Critical care was necessary to treat or prevent imminent or life-threatening deterioration of the following conditions:  Respiratory failure   Critical care was time spent  personally by me on the following activities:  Development of treatment plan with patient or surrogate, discussions with consultants, evaluation of patient's response to treatment, examination of patient, obtaining history from patient or surrogate, ordering and performing treatments and interventions, ordering and review of laboratory studies, ordering and review of radiographic studies, pulse oximetry, re-evaluation of patient's condition and review of old charts Procedure Name: Intubation Date/Time: 12/30/2018 7:47 AM Performed by: Gregor Hams, MD Pre-anesthesia Checklist: Patient identified, Patient being monitored, Emergency Drugs available, Timeout performed and Suction available Oxygen Delivery Method: Non-rebreather mask Preoxygenation: Pre-oxygenation with 100% oxygen Induction Type: Rapid sequence and Cricoid Pressure applied Ventilation: Mask ventilation without difficulty Laryngoscope Size: 4 and Mac Tube size: 7.5 mm Number of attempts: 1 Placement Confirmation: ETT inserted through vocal cords under direct vision,  CO2 detector and Breath sounds checked- equal and bilateral Secured at: 25 cm Tube secured with: ETT holder Dental Injury: Teeth and Oropharynx as per pre-operative assessment  Difficulty Due To: Difficulty was anticipated        ____________________________________________   INITIAL IMPRESSION / MDM / [place] / ED  COURSE  As part of my medical decision making, I reviewed the following data within the electronic MEDICAL RECORD NUMBER   53 year old male presented with above-stated history and physical exam concerning for acute respiratory failure secondary to COVID-19 infection.  Given patient's work of breathing and hypoxia with impending respiratory failure patient intubated on arrival to the emergency department.  Chest x-ray findings consistent with bilateral pneumonia.  Patient discussed with Dr. [place] Pink in [facility] who accepted the patient  in transfer.        ____________________________________________  FINAL CLINICAL IMPRESSION(S) / ED DIAGNOSES  Final diagnoses:  COVID-19  Acute respiratory failure with hypoxia ([place])     MEDICATIONS GIVEN DURING THIS VISIT:  Medications  fentaNYL 2558mg in NS 2568m(1051mml) infusion-PREMIX (has no administration in time range)  artificial tears (LACRILUBE) ophthalmic ointment 1 application (has no administration in time range)  fentaNYL (SUBLIMAZE) injection 50 mcg (has no administration in time range)  fentaNYL 2500m80mn NS 250mL42mmcg39m infusion-PREMIX (50 mcg/hr Intravenous New Bag/Given 12/30/18 0734)  fentaNYL (SUBLIMAZE) bolus via infusion 50 mcg (has no administration in time range)  midazolam (VERSED) 50 mg in sodium chloride 0.9 % 50 mL (1 mg/mL) infusion (7 mg/hr Intravenous New Bag/Given 12/30/18 0735)  midazolam (VERSED) bolus via infusion 1-2 mg (has no administration in time range)  rocuronium (ZEMURON) injection (10 mg Intravenous Given 12/30/18 0511)  succinylcholine (ANECTINE) injection (120 mg Intravenous Given 12/30/18 0455)  etomidate (AMIDATE) injection (20 mg Intravenous Given 12/30/18 0455)  midazolam (VERSED) 5 MG/5ML injection (4 mg Intravenous Given 12/30/18 0500)  propofol (DIPRIVAN) 500 MG/50ML infusion ( Intravenous Stopped 12/30/18 0733)  sodium chloride 0.9 % bolus 1,000 mL (0 mLs Intravenous Stopped 12/30/18 0737)  sodium chloride 0.9 % bolus 1,000 mL (0 mLs Intravenous Stopped 12/30/18 0737)  rocuronium (ZEMURON) injection (10 mg Intravenous Given 12/30/18 0550)     ED Discharge Orders    None       Note:  This document was prepared using Dragon voice recognition software and may include unintentional dictation errors.   Janny Crute,Gregor Hams5/01/20 [phone]

## 2018-12-30 NOTE — Progress Notes (Signed)
CRITICAL VALUE ALERT  Critical Value:  Troponin I = 0.37  Date & Time Notied:  12/30/2018; 1755 hrs  Provider Notified: Dr. Kendrick Fries  Orders Received/Actions taken: Order for 12-lead and Aspirin

## 2018-12-30 NOTE — ED Triage Notes (Signed)
Pt presents from home via acems with c/o respiratory distress. Pt was recently diagnosed with COVID-19 and feels he is getting worse. Pt respirations currently even and labored. Pt oxygen saturation 84% on room air. Pt 94% on 15L non-rebreather. MD Manson Passey at bedside. Pt alert and not able to talk in complete sentences due to respiratory effort.

## 2018-12-30 NOTE — ED Notes (Signed)
EMTALA reviewed. 

## 2018-12-30 NOTE — ED Notes (Signed)
Propofol D/C at this time.

## 2018-12-30 NOTE — H&P (Signed)
History and Physical   Brian Wilkinson NIO:[id number] DOB: August 19, 1966 DOA: 12/30/2018  Referring MD/NP/PA: Dr. Manson Passey, EDP, [facility] PCP: [facility]  Patient coming from: Home by way of [facility]  Chief Complaint: Respiratory distress  HPI: Brian Wilkinson is a 53 y.o. male with no known medical history who presented with progressive dyspnea which had progressed constantly and was severe, only modestly better with NRB en route to ED. He was not able to speak in complete sentences due to shortness of breath. In the ED he was intubated and found to be covid-positive with elevated inflammatory markers, leukopenia, and streaky bilateral opacities on CXR consistent with pneumonia. Procalcitonin and lactic acid were normal. Admission to [facility] was requested.   ROS: Unable to provide ROS or further history at this time due to sedation/ventilation. SH: None known FH: None known NKDA PMH: None Medications: None  Physical Exam: Vitals:   12/30/18 1150 12/30/18 1254 12/30/18 1300 12/30/18 1301  BP: 108/77  116/78   Pulse: (!) 48  (!) 47   Resp:   16   Temp: 97.7 F (36.5 C)     TempSrc: Axillary     SpO2: 100%  100% 100%  Weight:  73.5 kg    Height:  5\' 4"  (1.626 m)     Constitutional: 53 y.o. male sedated, comfortable on vent. Eyes: Lids and conjunctivae normal, PERRL ENMT: Mucous membranes are moist. ETT in place.  Neck: Normal, supple, no masses, no thyromegaly Respiratory: Ventilated, clear bilaterally.  Cardiovascular: Regular rate and rhythm, no murmurs, rubs, or gallops. No carotid bruits. No JVD. No LE edema. Palpable pedal pulses. Abdomen: Normoactive bowel sounds. Non-distended, and no masses palpated. No hepatosplenomegaly. GU: + indwelling catheter Musculoskeletal: No clubbing / cyanosis. No joint deformity upper and lower extremities. Good ROM, no contractures. Normal muscle tone.  Skin: Warm, dry. No rashes, wounds, or ulcers. No significant lesions noted.   Neurologic: Sedated, reflexes intact. Psychiatric: UTD  Labs on Admission: I have personally reviewed following labs and imaging studies  CBC: Recent Labs  Lab 12/30/18 0529  WBC 3.7*  NEUTROABS 2.7  HGB 12.6*  HCT 37.5*  MCV 94.7  PLT 152   Basic Metabolic Panel: Recent Labs  Lab 12/30/18 0529  NA 134*  K 3.8  CL 97*  CO2 25  GLUCOSE 133*  BUN 14  CREATININE 0.74  CALCIUM 8.0*   Liver Function Tests: Recent Labs  Lab 12/30/18 0529  AST 31  ALT 19  ALKPHOS 59  BILITOT 0.9  PROT 7.6  ALBUMIN 3.4*   CBG: Recent Labs  Lab 12/30/18 1234  GLUCAP 81   Lipid Profile: Recent Labs    12/30/18 0529  TRIG 211*   Anemia Panel: Recent Labs    12/30/18 0529  FERRITIN 1,306*   Recent Results (from the past 240 hour(s))  SARS Coronavirus 2 [facility] order, Performed in [facility] hospital lab)     Status: Abnormal   Collection Time: 12/22/18 11:28 PM  Result Value Ref Range Status   SARS Coronavirus 2 POSITIVE (A) NEGATIVE Final    Comment: RCRV ASHLEY SMITH RN 12/23/2018 @ 0105 RDW Performed at [facility], [address] [place], [place] [address]   Blood Culture (routine x 2)     Status: None (Preliminary result)   Collection Time: 12/30/18  5:28 AM  Result Value Ref Range Status   Specimen Description   Final    BLOOD LEFT ANTECUBITAL Performed at [facility]  Lab, [address] [place], [place] [address]    Special Requests   Final    BOTTLES DRAWN AEROBIC AND ANAEROBIC Blood Culture adequate volume Performed at [facility], [address] [place], [place] [address]    Culture   Final    NO GROWTH <12 HOURS Performed at [facility], [address] [place], [place] [address]    Report Status PENDING  Incomplete  Blood Culture (routine x 2)     Status: None (Preliminary result)   Collection Time: 12/30/18  5:28 AM  Result Value Ref Range Status   Specimen Description   Final    BLOOD BLOOD LEFT FOREARM  Performed at [facility], [address] [place], [place] [address]    Special Requests   Final    BOTTLES DRAWN AEROBIC AND ANAEROBIC Blood Culture adequate volume Performed at [facility], [address] [place], [place] [address]    Culture   Final    NO GROWTH <12 HOURS Performed at [facility], [address] [place], [place] [address]    Report Status PENDING  Incomplete     Radiological Exams on Admission: Dg Chest Port 1 View  Result Date: 12/30/2018 CLINICAL DATA:  Respiratory distress EXAM: PORTABLE CHEST 1 VIEW COMPARISON:  12/22/2018 FINDINGS: Endotracheal tube with tip 1.5 cm above the carina. The orogastric tube tip is in the gastric fundus. Low volume chest with streaky and hazy opacity on both sides. No Kerley lines, effusion, or pneumothorax. Normal heart size. IMPRESSION: 1. Endotracheal tube with tip 15 mm above the carina. 2. The orogastric tube tip is in the stomach. 3. Positive COVID test with bilateral pneumonia. Electronically Signed   By: Marnee SpringJonathon  Watts M.D.   On: 12/30/2018 06:06    Assessment/Plan Principal Problem:   Acute respiratory disease due to COVID-19 virus Active Problems:   Acute respiratory failure with hypoxia (HCC)   ARDS due to covid-19 infection:  - Intubated, comfortable on vent. Vent and sedation per PCCM  - Continue airborne, contact precautions. PPE including surgical gown, gloves, face shield, cap, shoe covers, and N-95 used during this encounter in a negative pressure room.  - Check daily labs: CBC w/diff, CMP, d-dimer, fibrinogen, ferritin, LDH, CRP - Troponin is ordered. - Enoxaparin prophylactic dose. - Blood cultures drawn.  - Maintain euvolemia/net negative.  - Avoid NSAIDs - Monitor CXR for evolution of infiltrates. - Will discuss use of actemra at 8mg /kg IV x1 with his wife. This patient is covid-confirmed with respiratory failure, not known to be on immunomodulators, anti-rejection medications, or  cancer chemotherapy, do not have another known active infection of diverticulitis/perforation. Platelets are normal (>50k), ANC is >500, and ALT/AST are normal with no known hepatitis B infection.    SUP/DVT prophylaxis: Pepcid, lovenox  Code Status: Full  Family Communication: Wife by phone by charge RN and CCM Disposition Plan: Remain in ICU Consults called: PCCM, Dr. Kendrick FriesMcQuaid  Admission status: Inpatient    Tyrone Nineyan B Laretha Luepke, MD [facility] [email or web address] Password [email or web address] 12/30/2018, 1:23 PM

## 2018-12-30 NOTE — Progress Notes (Signed)
RN spoke with patient's wife and niece and updated on plan of care.

## 2018-12-30 NOTE — Progress Notes (Signed)
[facility] PCCM  ABG showed PaO2 290 on 100% FiO2 Mild ARDS No need to prone Continue current management Will order lasix x1  Heber [place], MD [place] PCCM Pager: [phone] Cell: [phone] If no response, call [phone]

## 2018-12-30 NOTE — Plan of Care (Signed)
  Problem: Education: Goal: Knowledge of risk factors and measures for prevention of condition will improve Outcome: Progressing   Problem: Coping: Goal: Psychosocial and spiritual needs will be supported Outcome: Progressing   Problem: Respiratory: Goal: Will maintain a patent airway Outcome: Progressing Goal: Complications related to the disease process, condition or treatment will be avoided or minimized Outcome: Progressing   Problem: Respiratory: Goal: Will maintain a patent airway Outcome: Progressing Goal: Complications related to the disease process, condition or treatment will be avoided or minimized Outcome: Progressing   Problem: Education: Goal: Knowledge of General Education information will improve Description Including pain rating scale, medication(s)/side effects and non-pharmacologic comfort measures Outcome: Progressing   Problem: Health Behavior/Discharge Planning: Goal: Ability to manage health-related needs will improve Outcome: Progressing   Problem: Clinical Measurements: Goal: Ability to maintain clinical measurements within normal limits will improve Outcome: Progressing Goal: Will remain free from infection Outcome: Progressing Goal: Diagnostic test results will improve Outcome: Progressing Goal: Respiratory complications will improve Outcome: Progressing Goal: Cardiovascular complication will be avoided Outcome: Progressing   Problem: Activity: Goal: Risk for activity intolerance will decrease Outcome: Progressing   Problem: Activity: Goal: Risk for activity intolerance will decrease Outcome: Progressing   Problem: Nutrition: Goal: Adequate nutrition will be maintained Outcome: Progressing   Problem: Coping: Goal: Level of anxiety will decrease Outcome: Progressing   Problem: Elimination: Goal: Will not experience complications related to bowel motility Outcome: Progressing Goal: Will not experience complications related to  urinary retention Outcome: Progressing   Problem: Pain Managment: Goal: General experience of comfort will improve Outcome: Progressing   Problem: Safety: Goal: Ability to remain free from injury will improve Outcome: Progressing   Problem: Skin Integrity: Goal: Risk for impaired skin integrity will decrease Outcome: Progressing   Problem: Skin Integrity: Goal: Risk for impaired skin integrity will decrease Outcome: Progressing

## 2018-12-30 NOTE — ED Notes (Signed)
Fetanyl currently at 8mcg/hr, versed at 7mg /hr.

## 2018-12-30 NOTE — Progress Notes (Signed)
Per PICC team, patient will be unable to receive PICC until tomorrow AM. Patient scheduled for first case in AM.

## 2018-12-30 NOTE — ED Notes (Signed)
Brian Wilkinson [phone]) was called in order to give an update on the Pt since several family members have called to get an update. Emergency contact was not reached.

## 2018-12-30 NOTE — Consult Note (Signed)
NAME:  Brian Wilkinson, MRN:  [id number], DOB:  May 18, 1966, LOS: 0 ADMISSION DATE:  12/30/2018, CONSULTATION DATE: Dec 30, 2018 REFERRING MD: Dr. Jarvis Newcomer, CHIEF COMPLAINT: Dyspnea  Brief History   53 year old male with untreated hypertension admitted from [facility] to [facility] on Dec 30, 2018 for ARDS due to COVID.   History of present illness   This is a 53 year old male who has a past medical history significant for untreated hypertension who came to the [facility] on Dec 30, 2018 complaining of shortness of breath.  History could not be obtained from the patient because he was intubated so history was obtained by chart review.  It sounds as if on admission he was in extreme respiratory distress and required near immediate intubation.  He was started on sedation and was transferred to our facility for further management.  His SARS COV2 test was positive on 4/23 while he had mild symptoms so he was discharged home with instructions to self quarantine.    Past Medical History  Hypertension  Significant Hospital Events     Consults:  Pulmonary and critical care medicine  Procedures:  Endotracheal tube May 1>   Significant Diagnostic Tests:    Micro Data:  4/23 SARS COV2  Antimicrobials:   5/1 Actemra  Interim history/subjective:  As above  Objective   Blood pressure 116/78, pulse (!) 47, temperature 97.7 F (36.5 C), temperature source Axillary, resp. rate 16, height 5\' 4"  (1.626 m), weight 73.5 kg, SpO2 100 %.    Vent Mode: PRVC FiO2 (%):  [80 %-100 %] 100 % Set Rate:  [16 bmp-22 bmp] 22 bmp Vt Set:  [350 mL-470 mL] 350 mL PEEP:  [5 cmH20] 5 cmH20 Plateau Pressure:  [18 cmH20] 18 cmH20   Intake/Output Summary (Last 24 hours) at 12/30/2018 1407 Last data filed at 12/30/2018 1304 Gross per 24 hour  Intake 24.63 ml  Output 305 ml  Net -280.37 ml   Filed Weights   12/30/18 1254  Weight: 73.5 kg    Examination:   General:  In bed on vent HENT: NCAT ETT in place PULM: CTA B, vent supported breathing CV: RRR, no mgr GI: BS+, soft, nontender MSK: normal bulk and tone Neuro: sedated on vent    Resolved Hospital Problem list     Assessment & Plan:  ARDS due to COVID-19: Currently has poor oxygenation but reasonable lung compliance At this time there is no clear evidence of a concomitant bacterial infection Continue full mechanical ventilatory support, start ARDS vent management protocol Start with tidal volume of 7 cc per cake as he has adequate lung compliance at this point Monitor ABG closely, repeat ABG now Monitor PaO2 to FiO2 ratio, if less than 150 then consider prone positioning Diurese as blood pressure allows, goal CVP less than 4 Place PICC line Ventilator associated pneumonia prevention protocol Hold off on daily wake-up assessment for now as oxygen needs are elevated Titrate PEEP and FiO2 per the ARDS vent management protocol tables Start Actemra, discussed risks and benefits with his wife who says that she has no knowledge of underlying lung disease or chronic infections Monitor biomarkers per treatment algorithm  Need for sedation for mechanical ventilation: RA SS goal -2 to -3 Fentanyl infusion Change Versed infusion to PRN  Best practice:  Diet: tube feeding Pain/Anxiety/Delirium protocol (if indicated): yes, as above VAP protocol (if indicated): yes DVT prophylaxis: yes, lovenox GI prophylaxis: famotidine Glucose control: yes, monitor closely Mobility:  bed rest Code Status: full Family Communication: updated wife by phone on 5/1, she is also sick with COVID Disposition: ICU  Labs   CBC: Recent Labs  Lab 12/30/18 0529  WBC 3.7*  NEUTROABS 2.7  HGB 12.6*  HCT 37.5*  MCV 94.7  PLT 152    Basic Metabolic Panel: Recent Labs  Lab 12/30/18 0529  NA 134*  K 3.8  CL 97*  CO2 25  GLUCOSE 133*  BUN 14  CREATININE 0.74  CALCIUM 8.0*   GFR: Estimated  Creatinine Clearance: 100.3 mL/min (by C-G formula based on SCr of 0.74 mg/dL). Recent Labs  Lab 12/30/18 0529  PROCALCITON <0.10  WBC 3.7*  LATICACIDVEN 1.2    Liver Function Tests: Recent Labs  Lab 12/30/18 0529  AST 31  ALT 19  ALKPHOS 59  BILITOT 0.9  PROT 7.6  ALBUMIN 3.4*   No results for input(s): LIPASE, AMYLASE in the last 168 hours. No results for input(s): AMMONIA in the last 168 hours.  ABG    Component Value Date/Time   HCO3 27.3 12/30/2018 0519   O2SAT 95.5 12/30/2018 0519     Coagulation Profile: No results for input(s): INR, PROTIME in the last 168 hours.  Cardiac Enzymes: No results for input(s): CKTOTAL, CKMB, CKMBINDEX, TROPONINI in the last 168 hours.  HbA1C: No results found for: HGBA1C  CBG: Recent Labs  Lab 12/30/18 1234  GLUCAP 81    Review of Systems Past Medical History Surgical History Social History Family History:   Cannot obtain due to intubation  Allergies No Known Allergies   Home Medications  Prior to Admission medications   Not on File     Critical care time: 40 minutes    Heber CarolinaBrent McQuaid, MD [place] [facility] Pager: [phone] Cell: [phone] If no response, call [phone]

## 2018-12-30 NOTE — Progress Notes (Signed)
RT called to patient room due to Respiratory Distress. Patient was successfully intubated by ED MD. Patient was shortly proned before turning patient back into supine position. RN notified RT that patient ventilator was alarming "Ventilator Maintenance Service Needed". RT successfully placed patient on new Trilogy # [id number] at 0630AM. RN at bedside and aware. Report given to dayshift ED RT.

## 2018-12-30 NOTE — ED Notes (Signed)
Report received, care of pt assumed at this time.  Pt intubated, sedated with versed and fentanyl.  Resting quietly at this time.  Awaiting placement at [facility].  VSS, Will continue to monitor closely.

## 2018-12-30 NOTE — Progress Notes (Signed)
Received pt from [facility] via [facility]. Pt taken off their vent and placed on our without complications. Pt stable throughout. RT will continue to monitor

## 2018-12-30 NOTE — Progress Notes (Signed)
Shift summary: patient admitted from [facility] ED to [facility] ICU. Patient remains intubated and sedated. Troponins elevated: continuing to follow. TFs initiated. VS stable.

## 2018-12-30 NOTE — ED Notes (Signed)
Pt continues to await transport to [facility].  VSS, sedation in place, pt resting quietly on ER stretcher.  Will continue to monitor closely.

## 2018-12-31 ENCOUNTER — Inpatient Hospital Stay (HOSPITAL_COMMUNITY): Payer: HRSA Program

## 2018-12-31 ENCOUNTER — Encounter (HOSPITAL_COMMUNITY): Payer: Self-pay

## 2018-12-31 DIAGNOSIS — J069 Acute upper respiratory infection, unspecified: Secondary | ICD-10-CM | POA: Diagnosis not present

## 2018-12-31 DIAGNOSIS — R7989 Other specified abnormal findings of blood chemistry: Secondary | ICD-10-CM | POA: Diagnosis not present

## 2018-12-31 DIAGNOSIS — J9601 Acute respiratory failure with hypoxia: Secondary | ICD-10-CM | POA: Diagnosis not present

## 2018-12-31 DIAGNOSIS — U071 COVID-19: Secondary | ICD-10-CM | POA: Diagnosis not present

## 2018-12-31 LAB — CBC WITH DIFFERENTIAL/PLATELET
Abs Immature Granulocytes: 0.02 10*3/uL (ref 0.00–0.07)
Basophils Absolute: 0 10*3/uL (ref 0.0–0.1)
Basophils Relative: 0 %
Eosinophils Absolute: 0 10*3/uL (ref 0.0–0.5)
Eosinophils Relative: 1 %
HCT: 35 % — ABNORMAL LOW (ref 39.0–52.0)
Hemoglobin: 11.7 g/dL — ABNORMAL LOW (ref 13.0–17.0)
Immature Granulocytes: 1 %
Lymphocytes Relative: 22 %
Lymphs Abs: 0.5 10*3/uL — ABNORMAL LOW (ref 0.7–4.0)
MCH: 32.5 pg (ref 26.0–34.0)
MCHC: 33.4 g/dL (ref 30.0–36.0)
MCV: 97.2 fL (ref 80.0–100.0)
Monocytes Absolute: 0.2 10*3/uL (ref 0.1–1.0)
Monocytes Relative: 7 %
Neutro Abs: 1.7 10*3/uL (ref 1.7–7.7)
Neutrophils Relative %: 69 %
Platelets: 172 10*3/uL (ref 150–400)
RBC: 3.6 MIL/uL — ABNORMAL LOW (ref 4.22–5.81)
RDW: 11.9 % (ref 11.5–15.5)
WBC: 2.4 10*3/uL — ABNORMAL LOW (ref 4.0–10.5)
nRBC: 0 % (ref 0.0–0.2)

## 2018-12-31 LAB — COMPREHENSIVE METABOLIC PANEL
ALT: 23 U/L (ref 0–44)
AST: 37 U/L (ref 15–41)
Albumin: 2.9 g/dL — ABNORMAL LOW (ref 3.5–5.0)
Alkaline Phosphatase: 52 U/L (ref 38–126)
Anion gap: 10 (ref 5–15)
BUN: 18 mg/dL (ref 6–20)
CO2: 27 mmol/L (ref 22–32)
Calcium: 8.1 mg/dL — ABNORMAL LOW (ref 8.9–10.3)
Chloride: 100 mmol/L (ref 98–111)
Creatinine, Ser: 0.61 mg/dL (ref 0.61–1.24)
GFR calc Af Amer: >60 mL/min (ref >60)
GFR calc non Af Amer: >60 mL/min (ref >60)
Glucose, Bld: 149 mg/dL — ABNORMAL HIGH (ref 70–99)
Potassium: 3.5 mmol/L (ref 3.5–5.1)
Sodium: 137 mmol/L (ref 135–145)
Total Bilirubin: 0.5 mg/dL (ref 0.3–1.2)
Total Protein: 6.8 g/dL (ref 6.5–8.1)

## 2018-12-31 LAB — GLUCOSE, CAPILLARY
Glucose-Capillary: 100 mg/dL — ABNORMAL HIGH (ref 70–99)
Glucose-Capillary: 115 mg/dL — ABNORMAL HIGH (ref 70–99)
Glucose-Capillary: 85 mg/dL (ref 70–99)

## 2018-12-31 LAB — TROPONIN I
Troponin I: 0.21 ng/mL (ref <0.03)
Troponin I: 0.11 ng/mL (ref <0.03)

## 2018-12-31 LAB — FERRITIN: Ferritin: 1080 ng/mL — ABNORMAL HIGH (ref 24–336)

## 2018-12-31 LAB — HEMOGLOBIN A1C
Hgb A1c MFr Bld: 5.6 % (ref 4.8–5.6)
Mean Plasma Glucose: 114.02 mg/dL

## 2018-12-31 LAB — MAGNESIUM: Magnesium: 2 mg/dL (ref 1.7–2.4)

## 2018-12-31 LAB — LACTATE DEHYDROGENASE: LDH: 239 U/L — ABNORMAL HIGH (ref 98–192)

## 2018-12-31 LAB — TRIGLYCERIDES: Triglycerides: 251 mg/dL — ABNORMAL HIGH (ref <150)

## 2018-12-31 LAB — PHOSPHORUS: Phosphorus: 4 mg/dL (ref 2.5–4.6)

## 2018-12-31 LAB — C-REACTIVE PROTEIN: CRP: 12.1 mg/dL — ABNORMAL HIGH (ref <1.0)

## 2018-12-31 LAB — D-DIMER, QUANTITATIVE: D-Dimer, Quant: 0.87 ug/mL-FEU — ABNORMAL HIGH (ref 0.00–0.50)

## 2018-12-31 MED ORDER — PHENOL 1.4 % MT LIQD
1.0000 | OROMUCOSAL | Status: DC | PRN
  Filled 2018-12-31: qty 177

## 2018-12-31 MED ORDER — MENTHOL 3 MG MT LOZG
1.0000 | LOZENGE | OROMUCOSAL | Status: DC | PRN
  Filled 2018-12-31: qty 9

## 2018-12-31 NOTE — Progress Notes (Signed)
PROGRESS NOTE  Brian Wilkinson  RDE:[id number] DOB: 1966-02-17 DOA: 12/30/2018 PCP: [facility] [place]   Brief Narrative: Brian Wilkinson is a 53 y.o. male with no significant medical history who presented to [facility] with progressive dyspnea which had progressed constantly and was severe, only modestly better with NRB en route to ED. He was not able to speak in complete sentences due to shortness of breath. In the ED he was intubated and found to be covid-positive with elevated inflammatory markers, leukopenia, and streaky bilateral opacities on CXR consistent with pneumonia. Procalcitonin and lactic acid were normal. Admission to [facility] was requested. Oxygenation improved and he is extubated 5/2.  Assessment & Plan: Principal Problem:   Acute respiratory disease due to COVID-19 virus Active Problems:   Acute respiratory failure with hypoxia (HCC)  ARDS due to covid-19 infection: ETT 5/1 - 5/2. CXR independently reviewed this morning, showing stable low volume with patchy airspace opacities, no significant progression. Received actemra 5/1. - Continue airborne, contact precautions. PPE including surgical gown, gloves, face shield, cap, shoe covers, and N-95 used during this encounter in a negative pressure room.  - Trend inflammatory labs: CBC w/diff, CMP, d-dimer, fibrinogen, ferritin, LDH, CRP - Enoxaparin prophylactic dose. - Blood cultures drawn.  - Maintain euvolemia/net negative.  - Avoid NSAIDs - Prone as able, IS as able.  Troponin elevation: Suspect demand ischemia due to covid infection, trending downward, ECG with sinus bradycardia without ischemic features. This morning he denied chest pain. - Started aspirin, consider outpatient work up. A1c pending.   DVT prophylaxis: Lovenox Code Status: Full Family Communication: Team will contact wife by phone.  Disposition Plan: Close monitoring in ICU for now.  Consultants:   PCCM  Procedures:   ETT 5/1 - 5/2   Antimicrobials:  None   Subjective: Passing SBT, interactive. After extubation he is using his phone in bed calmly.  Objective: Vitals:   12/31/18 0500 12/31/18 0600 12/31/18 0746 12/31/18 0858  BP: 127/86 130/89 (!) 135/94 132/87  Pulse: (!) 58 66 70 65  Resp:   (!) 26 20  Temp:      TempSrc:      SpO2: 99% 98% 99% 100%  Weight: 76.3 kg     Height:        Intake/Output Summary (Last 24 hours) at 12/31/2018 0953 Last data filed at 12/31/2018 0600 Gross per 24 hour  Intake 1250.67 ml  Output 2605 ml  Net -1354.33 ml   Filed Weights   12/30/18 1254 12/31/18 0500  Weight: 73.5 kg 76.3 kg   Gen: 53 y.o. male in no distress Pulm: Vent-supported breaths. Clear to auscultation bilaterally.  CV: Regular rate and rhythm. No murmur, rub, or gallop. No JVD, no pedal edema. GI: Abdomen soft, non-tender, non-distended, with normoactive bowel sounds. No organomegaly or masses felt. Ext: Warm, no deformities Skin: No rashes, lesions or ulcers Neuro: Alert. No focal neurological deficits. Psych: UTD as he's unable to speak with ETT, though cooperative and following commands.  Data Reviewed: I have personally reviewed following labs and imaging studies  CBC: Recent Labs  Lab 12/30/18 0529 12/30/18 1415 12/31/18 0700  WBC 3.7*  --  2.4*  NEUTROABS 2.7  --  1.7  HGB 12.6* 9.2* 11.7*  HCT 37.5* 27.0* 35.0*  MCV 94.7  --  97.2  PLT 152  --  631   Basic Metabolic Panel: Recent Labs  Lab 12/30/18 0529 12/30/18 1415 12/30/18 1600  NA 134* 137  --  K 3.8 4.2  --   CL 97*  --   --   CO2 25  --   --   GLUCOSE 133*  --   --   BUN 14  --   --   CREATININE 0.74  --   --   CALCIUM 8.0*  --   --   MG  --   --  2.4  PHOS  --   --  4.0   GFR: Estimated Creatinine Clearance: 102 mL/min (by C-G formula based on SCr of 0.74 mg/dL). Liver Function Tests: Recent Labs  Lab 12/30/18 0529  AST 31  ALT 19  ALKPHOS 59  BILITOT 0.9  PROT 7.6  ALBUMIN 3.4*   No results for  input(s): LIPASE, AMYLASE in the last 168 hours. No results for input(s): AMMONIA in the last 168 hours. Coagulation Profile: No results for input(s): INR, PROTIME in the last 168 hours. Cardiac Enzymes: Recent Labs  Lab 12/30/18 1600 12/30/18 2305  TROPONINI 0.37* 0.21*   BNP (last 3 results) No results for input(s): PROBNP in the last 8760 hours. HbA1C: No results for input(s): HGBA1C in the last 72 hours. CBG: Recent Labs  Lab 12/30/18 1535 12/30/18 1600 12/30/18 2057 12/31/18 0003 12/31/18 0444  GLUCAP 67* 123* 85 100* 115*   Lipid Profile: Recent Labs    12/30/18 0529  TRIG 211*   Thyroid Function Tests: No results for input(s): TSH, T4TOTAL, FREET4, T3FREE, THYROIDAB in the last 72 hours. Anemia Panel: Recent Labs    12/30/18 0529 12/31/18 0700  FERRITIN 1,306* 1,080*   Urine analysis: No results found for: COLORURINE, APPEARANCEUR, LABSPEC, [place], GLUCOSEU, HGBUR, BILIRUBINUR, KETONESUR, [place], UROBILINOGEN, NITRITE, LEUKOCYTESUR Recent Results (from the past 240 hour(s))  SARS Coronavirus 2 [facility] order, Performed in [place] hospital lab)     Status: Abnormal   Collection Time: 12/22/18 11:28 PM  Result Value Ref Range Status   SARS Coronavirus 2 POSITIVE (A) NEGATIVE Final    Comment: RCRV ASHLEY SMITH RN 12/23/2018 @ 0105 RDW Performed at [facility], [address] [place], [place] [address]   Blood Culture (routine x 2)     Status: None (Preliminary result)   Collection Time: 12/30/18  5:28 AM  Result Value Ref Range Status   Specimen Description BLOOD LEFT ANTECUBITAL  Final   Special Requests   Final    BOTTLES DRAWN AEROBIC AND ANAEROBIC Blood Culture adequate volume   Culture   Final    NO GROWTH 1 DAY Performed at [facility], [address] [place], [place] [address]    Report Status PENDING  Incomplete  Blood Culture (routine x 2)     Status: None (Preliminary result)   Collection Time: 12/30/18   5:28 AM  Result Value Ref Range Status   Specimen Description BLOOD BLOOD LEFT FOREARM  Final   Special Requests   Final    BOTTLES DRAWN AEROBIC AND ANAEROBIC Blood Culture adequate volume   Culture   Final    NO GROWTH 1 DAY Performed at [facility], [address] [place], [place] [address]    Report Status PENDING  Incomplete  MRSA PCR Screening     Status: None   Collection Time: 12/30/18  1:15 PM  Result Value Ref Range Status   MRSA by PCR NEGATIVE NEGATIVE Final    Comment:        The GeneXpert MRSA Assay (FDA approved for NASAL specimens only), is one component of a  comprehensive MRSA colonization surveillance program. It is not intended to diagnose MRSA infection nor to guide or monitor treatment for MRSA infections. Performed at [facility], [place] [address] [place],  [address]       Radiology Studies: Dg Chest Port 1 View  Result Date: 12/31/2018 CLINICAL DATA:  Acute respiratory failure. EXAM: PORTABLE CHEST 1 VIEW COMPARISON:  Chest x-ray from yesterday. FINDINGS: Endotracheal tube tip 2.0 cm above the carina. Unchanged enteric tube. Stable cardiomediastinal silhouette. Normal pulmonary vascularity. Persistent low lung volumes with relatively unchanged patchy asymmetric basilar predominant opacities. No pleural effusion or pneumothorax. No acute osseous abnormality. IMPRESSION: 1. Unchanged bibasilar pneumonia. Electronically Signed   By: Titus Dubin M.D.   On: 12/31/2018 05:49   Dg Chest Port 1 View  Result Date: 12/30/2018 CLINICAL DATA:  Respiratory distress EXAM: PORTABLE CHEST 1 VIEW COMPARISON:  12/22/2018 FINDINGS: Endotracheal tube with tip 1.5 cm above the carina. The orogastric tube tip is in the gastric fundus. Low volume chest with streaky and hazy opacity on both sides. No Kerley lines, effusion, or pneumothorax. Normal heart size. IMPRESSION: 1. Endotracheal tube with tip 15 mm above the carina. 2. The orogastric  tube tip is in the stomach. 3. Positive COVID test with bilateral pneumonia. Electronically Signed   By: Monte Fantasia M.D.   On: 12/30/2018 06:06    Scheduled Meds: . aspirin  325 mg Per Tube Daily  . chlorhexidine gluconate (MEDLINE KIT)  15 mL Mouth Rinse BID  . enoxaparin (LOVENOX) injection  40 mg Subcutaneous Daily   Continuous Infusions: . sodium chloride 10 mL/hr at 12/31/18 0600  . famotidine (PEPCID) IV Stopped (12/30/18 2143)     LOS: 1 day   Time spent: 35 minutes.  Patrecia Pour, MD [facility] [email or web address] Password [facility] 12/31/2018, 9:53 AM

## 2018-12-31 NOTE — Procedures (Signed)
Extubation Procedure Note  Patient Details:   Name: Brian Wilkinson DOB: June 27, 1966 MRN: [id number]   Airway Documentation:    Vent end date: 12/31/18 Vent end time: 0855   Evaluation  O2 sats: stable throughout Complications: No apparent complications Patient did tolerate procedure well. Bilateral Breath Sounds: Clear, Diminished   Yes   Positive cuff leak noted. Patient placed on NRB mask 15 L, no stridor noted.  [name] 12/31/2018, 9:08 AM

## 2018-12-31 NOTE — Progress Notes (Signed)
Spoke with Verlon Au RN re PICC order. States she is unsure if pt still needs the PICC as overall, pt has improved over night.   RN to call Dr Kendrick Fries re need, per note for CVP pressures and diuresing. RN requests call back around 0800.  Will wait to obtain consent from wife.

## 2018-12-31 NOTE — Progress Notes (Signed)
Spoke twice more with Arline Asp RN re PICC order.  Dr Kendrick Fries to cancel order for PICC placement.

## 2018-12-31 NOTE — Progress Notes (Signed)
RT instructed patient on the use of incentive spirometer. Patient able to reach 750 mL.

## 2018-12-31 NOTE — Progress Notes (Signed)
Patient is going to transfer to [facility]. Progressive unit. Called and gave report to [place]. Patient is eating dinner, tolerating well. Will take to unit as soon as he is finished with meal.

## 2018-12-31 NOTE — Progress Notes (Signed)
Versed infusion discontinued upon admission to [facility]. Infusion started at [facility]. 55 mL of versed from IV infusion bag wasted with 2 RN verification. Second RN verified by Darlina Sicilian, RN.

## 2018-12-31 NOTE — Progress Notes (Signed)
Spoke to patient's son, Jon Gills. Updated him on patient's status and answered all questions and concerns regarding patient. Jon Gills spoke to his father and he felt better after he spoke to his father. He was very grateful for the update. He said he would relay all information to his mother and the rest of his family.

## 2018-12-31 NOTE — Progress Notes (Signed)
NAME:  Brian ClimesLuis M Wilkinson, MRN:  [id number], DOB:  1965-10-28, LOS: 1 ADMISSION DATE:  12/30/2018, CONSULTATION DATE: Dec 30, 2018 REFERRING MD: Dr. Jarvis NewcomerGrunz, CHIEF COMPLAINT: Dyspnea  Brief History   53 year old male with untreated hypertension admitted from [facility] to [facility] on Dec 30, 2018 for ARDS due to COVID.    Past Medical History  Hypertension  Significant Hospital Events     Consults:  Pulmonary and critical care medicine  Procedures:  Endotracheal tube May 1>   Significant Diagnostic Tests:    Micro Data:  4/23 SARS COV2  Antimicrobials:   5/1 Actemra   Interim history/subjective:  Passing SBT Awake and alert this morning, following commands.  Objective   Blood pressure 130/89, pulse 66, temperature 97.6 F (36.4 C), temperature source Axillary, resp. rate (!) 23, height 5\' 4"  (1.626 m), weight 76.3 kg, SpO2 98 %.    Vent Mode: PRVC FiO2 (%):  [40 %-100 %] 40 % Set Rate:  [16 bmp-22 bmp] 22 bmp Vt Set:  [350 mL-470 mL] 350 mL PEEP:  [5 cmH20] 5 cmH20 Plateau Pressure:  [15 cmH20-18 cmH20] 15 cmH20   Intake/Output Summary (Last 24 hours) at 12/31/2018 0754 Last data filed at 12/31/2018 0600 Gross per 24 hour  Intake 1250.67 ml  Output 2605 ml  Net -1354.33 ml   Filed Weights   12/30/18 1254 12/31/18 0500  Weight: 73.5 kg 76.3 kg    Examination:   General:  In bed on vent HENT: NCAT ETT in place PULM: CTA B, vent supported breathing CV: RRR, no mgr GI: BS+, soft, nontender MSK: normal bulk and tone Neuro: awake, alert, following commands   Resolved Hospital Problem list     Assessment & Plan:   ARDS due to COVID-19: Currently has poor oxygenation but reasonable lung compliance At this time there is no clear evidence of a concomitant bacterial infection Extubate [facility] protocol Monitor O2 saturation closely Advance diet later today Head of bed greater than 30 degrees,  aspiration precautions Discuss second dose of Actemra with pharmacy today Continue to monitor biomarkers  Leukopenia: suspect due to active infection/COVID Monitor daily CBC  Need for sedation for mechanical ventilation: D/c sedation protocol   Best practice:  Diet: tube feeding Pain/Anxiety/Delirium protocol (if indicated): n/a VAP protocol (if indicated): n/a DVT prophylaxis: yes, lovenox GI prophylaxis: famotidine Glucose control: yes, monitor closely Mobility: bed rest Code Status: full Family Communication: updated wife by phone on 5/2 Disposition: ICU   Labs   CBC: Recent Labs  Lab 12/30/18 0529 12/30/18 1415  WBC 3.7*  --   NEUTROABS 2.7  --   HGB 12.6* 9.2*  HCT 37.5* 27.0*  MCV 94.7  --   PLT 152  --     Basic Metabolic Panel: Recent Labs  Lab 12/30/18 0529 12/30/18 1415 12/30/18 1600  NA 134* 137  --   K 3.8 4.2  --   CL 97*  --   --   CO2 25  --   --   GLUCOSE 133*  --   --   BUN 14  --   --   CREATININE 0.74  --   --   CALCIUM 8.0*  --   --   MG  --   --  2.4  PHOS  --   --  4.0   GFR: Estimated Creatinine Clearance: 102 mL/min (by C-G formula based on SCr of 0.74 mg/dL). Recent Labs  Lab  12/30/18 0529  PROCALCITON <0.10  WBC 3.7*  LATICACIDVEN 1.2    Liver Function Tests: Recent Labs  Lab 12/30/18 0529  AST 31  ALT 19  ALKPHOS 59  BILITOT 0.9  PROT 7.6  ALBUMIN 3.4*   No results for input(s): LIPASE, AMYLASE in the last 168 hours. No results for input(s): AMMONIA in the last 168 hours.  ABG    Component Value Date/Time   PHART 7.391 12/30/2018 1415   PCO2ART 44.5 12/30/2018 1415   PO2ART 290.0 (H) 12/30/2018 1415   HCO3 27.1 12/30/2018 1415   TCO2 28 12/30/2018 1415   O2SAT 100.0 12/30/2018 1415     Coagulation Profile: No results for input(s): INR, PROTIME in the last 168 hours.  Cardiac Enzymes: Recent Labs  Lab 12/30/18 1600 12/30/18 2305  TROPONINI 0.37* 0.21*    HbA1C: No results found for:  HGBA1C  CBG: Recent Labs  Lab 12/30/18 1535 12/30/18 1600 12/30/18 2057 12/31/18 0003 12/31/18 0444  GLUCAP 67* 123* 85 100* 115*     Critical care time: 35 minutes      Heber Cumberland, MD [place] [facility] Pager: [phone] Cell: [phone] If no response, call [phone]

## 2018-12-31 NOTE — Care Management (Signed)
Case manager will continue to monitor patient for disposition as he continues to medically improve. May God bless him to do so.    Vance Peper, RN BSN Case Manager [phone]

## 2018-12-31 NOTE — Progress Notes (Signed)
PT NOTE  Aware of order for PT eval, deferring at this time  to see new orders  in PCU, will see as schedule permits.   Drucilla Chalet, PT  Pager: [phone] Acute Rehab Dept [facility]): [phone]   12/31/2018

## 2019-01-01 DIAGNOSIS — J069 Acute upper respiratory infection, unspecified: Secondary | ICD-10-CM | POA: Diagnosis not present

## 2019-01-01 DIAGNOSIS — U071 COVID-19: Secondary | ICD-10-CM | POA: Diagnosis not present

## 2019-01-01 LAB — CBC WITH DIFFERENTIAL/PLATELET
Abs Immature Granulocytes: 0.01 10*3/uL (ref 0.00–0.07)
Basophils Absolute: 0 10*3/uL (ref 0.0–0.1)
Basophils Relative: 0 %
Eosinophils Absolute: 0.1 10*3/uL (ref 0.0–0.5)
Eosinophils Relative: 2 %
HCT: 35.6 % — ABNORMAL LOW (ref 39.0–52.0)
Hemoglobin: 11.8 g/dL — ABNORMAL LOW (ref 13.0–17.0)
Immature Granulocytes: 0 %
Lymphocytes Relative: 18 %
Lymphs Abs: 0.5 10*3/uL — ABNORMAL LOW (ref 0.7–4.0)
MCH: 32 pg (ref 26.0–34.0)
MCHC: 33.1 g/dL (ref 30.0–36.0)
MCV: 96.5 fL (ref 80.0–100.0)
Monocytes Absolute: 0.2 10*3/uL (ref 0.1–1.0)
Monocytes Relative: 7 %
Neutro Abs: 2 10*3/uL (ref 1.7–7.7)
Neutrophils Relative %: 73 %
Platelets: 241 10*3/uL (ref 150–400)
RBC: 3.69 MIL/uL — ABNORMAL LOW (ref 4.22–5.81)
RDW: 11.4 % — ABNORMAL LOW (ref 11.5–15.5)
WBC Morphology: INCREASED
WBC: 2.8 10*3/uL — ABNORMAL LOW (ref 4.0–10.5)
nRBC: 0 % (ref 0.0–0.2)

## 2019-01-01 LAB — COMPREHENSIVE METABOLIC PANEL
ALT: 24 U/L (ref 0–44)
AST: 31 U/L (ref 15–41)
Albumin: 3 g/dL — ABNORMAL LOW (ref 3.5–5.0)
Alkaline Phosphatase: 52 U/L (ref 38–126)
Anion gap: 11 (ref 5–15)
BUN: 13 mg/dL (ref 6–20)
CO2: 27 mmol/L (ref 22–32)
Calcium: 8.4 mg/dL — ABNORMAL LOW (ref 8.9–10.3)
Chloride: 100 mmol/L (ref 98–111)
Creatinine, Ser: 0.61 mg/dL (ref 0.61–1.24)
GFR calc Af Amer: >60 mL/min (ref >60)
GFR calc non Af Amer: >60 mL/min (ref >60)
Glucose, Bld: 98 mg/dL (ref 70–99)
Potassium: 3.5 mmol/L (ref 3.5–5.1)
Sodium: 138 mmol/L (ref 135–145)
Total Bilirubin: 0.3 mg/dL (ref 0.3–1.2)
Total Protein: 6.9 g/dL (ref 6.5–8.1)

## 2019-01-01 LAB — PHOSPHORUS: Phosphorus: 3.9 mg/dL (ref 2.5–4.6)

## 2019-01-01 LAB — TRIGLYCERIDES: Triglycerides: 197 mg/dL — ABNORMAL HIGH (ref <150)

## 2019-01-01 LAB — LACTATE DEHYDROGENASE: LDH: 221 U/L — ABNORMAL HIGH (ref 98–192)

## 2019-01-01 LAB — FERRITIN: Ferritin: 784 ng/mL — ABNORMAL HIGH (ref 24–336)

## 2019-01-01 LAB — C-REACTIVE PROTEIN: CRP: 7.6 mg/dL — ABNORMAL HIGH (ref <1.0)

## 2019-01-01 LAB — HIV ANTIBODY (ROUTINE TESTING W REFLEX): HIV Screen 4th Generation wRfx: NONREACTIVE

## 2019-01-01 LAB — D-DIMER, QUANTITATIVE: D-Dimer, Quant: 0.7 ug/mL-FEU — ABNORMAL HIGH (ref 0.00–0.50)

## 2019-01-01 LAB — MAGNESIUM: Magnesium: 2.1 mg/dL (ref 1.7–2.4)

## 2019-01-01 MED ORDER — METHYLPREDNISOLONE SODIUM SUCC 125 MG IJ SOLR
80.0000 mg | Freq: Two times a day (BID) | INTRAMUSCULAR | Status: DC
  Administered 2019-01-01 (×2): 80 mg via INTRAVENOUS
  Administered 2019-01-02: 22:00:00 62.5 mg via INTRAVENOUS
  Administered 2019-01-02 – 2019-01-04 (×4): 80 mg via INTRAVENOUS
  Filled 2019-01-01 (×7): qty 2

## 2019-01-01 MED ORDER — POTASSIUM CHLORIDE CRYS ER 20 MEQ PO TBCR
40.0000 meq | EXTENDED_RELEASE_TABLET | Freq: Once | ORAL | Status: AC
  Administered 2019-01-01: 40 meq via ORAL
  Filled 2019-01-01: qty 2

## 2019-01-01 MED ORDER — FAMOTIDINE 20 MG PO TABS
20.0000 mg | ORAL_TABLET | Freq: Two times a day (BID) | ORAL | Status: DC
  Administered 2019-01-01 – 2019-01-04 (×6): 20 mg via ORAL
  Filled 2019-01-01 (×6): qty 1

## 2019-01-01 MED ORDER — ASPIRIN 81 MG PO CHEW
325.0000 mg | CHEWABLE_TABLET | Freq: Every day | ORAL | Status: DC
  Administered 2019-01-02 – 2019-01-03 (×2): 325 mg via ORAL
  Filled 2019-01-01: qty 5

## 2019-01-01 NOTE — Progress Notes (Signed)
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 Due to being sick, he was living in his shed to avoid getting his wife sick. Per the pt, she is now sick/Covid + but at home. Session completed on 2L with SpO2 94; RR 24 - 37; HR 69 and BP 142/103. Able to ambulate to the bathroom, followed by ambulation @ 12540ft with HHA on 2L with 2 rest breaks due to SOB. Attempted to leave pt in the prone position, but pt unable to tolerate. Left sitting upright in chair position. Pt very appreciative. Will follow acutely to facilitate safe DC home. [facility].     01/01/19 0900  OT Visit Information  Last OT Received On 01/01/19  Assistance Needed +1  History of Present Illness 53 y.o. male with no significant medical history who [facility] with progressive dyspnea which had progressed constantly and was severe, only modestly better with NRB en route to ED. He was not able to speak in complete sentences due to shortness of breath. In the ED he was intubated and found to be covid-positive with elevated inflammatory markers, leukopenia, and streaky bilateral opacities on CXR consistent with pneumonia. Procalcitonin and lactic acid were normal. Admission to [facility] was requested. Oxygenation improved and he is extubated 5/2.  Precautions  Precaution Comments monitor vitals  Home Living  Family/patient expects to be discharged to: Private residence  Living Arrangements Spouse/significant other;Children  Available Help at Discharge Family;Available 24 hours/day  Type of Home House  Home Access Stairs to enter  Entrance Stairs-Number of Steps 5  Entrance Stairs-Rails Left  Home Layout One level  Bathroom Shower/Tub Tub/shower unit;Curtain  Horticulturist, commercialBathroom Toilet Standard  Bathroom Accessibility Yes  How Accessible Accessible via walker  Home Equipment None  Prior Function  Level of Independence  Independent  Comments drives; landscaper; works for Engineer, 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  Communication  Communication Prefers language other than English (OK to not use interpreter)  Pain Assessment  Pain Assessment No/denies pain  Cognition  Arousal/Alertness Awake/alert  Behavior During Therapy WFL for tasks assessed/performed  Overall Cognitive Status Within Functional Limits for tasks assessed  Upper Extremity Assessment  Upper Extremity Assessment Overall WFL for tasks assessed  Lower Extremity Assessment  Lower Extremity Assessment Defer to PT evaluation  Cervical / Trunk Assessment  Cervical / Trunk Assessment Normal  ADL  Overall ADL's  Needs assistance/impaired  Eating/Feeding Independent  Grooming Set up;Standing  Upper Body Bathing Set up;Sitting  Lower Body Bathing Min guard;Sit to/from stand  Upper Body Dressing  Set up;Sitting  Lower Body Dressing Min guard;Sit to/from Estate agentstand  Toilet Transfer Minimal assistance;Regular Toilet;Ambulation  Toileting- Clothing Manipulation and Hygiene Set up;Sit to/from stand  Functional mobility during ADLs Minimal assistance (mild unsteadiness)  General ADL Comments Easily fatigues  Bed Mobility  Overal bed mobility Modified Independent  Transfers  Overall transfer level Needs assistance  Transfers Sit to/from Stand  Sit to Stand Min guard  Balance  Overall balance assessment Needs assistance  Sitting balance-Leahy Scale Good  Standing balance-Leahy Scale Fair  Exercises  Exercises Other exercises  Other Exercises  Other Exercises encouraged use of incentive spirometer  OT - End of Session  Equipment Utilized During Treatment Oxygen (2L)  Activity Tolerance Patient tolerated treatment well  Patient left in bed;with call bell/phone within reach  Nurse Communication Mobility status;Other (comment) (unable to tolerate prone position at this time)  OT  Assessment  OT Recommendation/Assessment Patient needs continued OT Services  OT Visit  Diagnosis Unsteadiness on feet (R26.81);Muscle weakness (generalized) (M62.81)  OT Problem List Decreased activity tolerance;Cardiopulmonary status limiting activity  OT Plan  OT Frequency (ACUTE ONLY) Min 3X/week  OT Treatment/Interventions (ACUTE ONLY) Self-care/ADL training;Therapeutic exercise;Energy conservation;DME and/or AE instruction;Therapeutic activities;Patient/family education  AM-PAC OT "6 Clicks" Daily Activity Outcome Measure (Version 2)  Help from another person eating meals? 4  Help from another person taking care of personal grooming? 4  Help from another person toileting, which includes using toliet, bedpan, or urinal? 3  Help from another person bathing (including washing, rinsing, drying)? 3  Help from another person to put on and taking off regular upper body clothing? 4  Help from another person to put on and taking off regular lower body clothing? 3  6 Click Score 21  OT Recommendation  Follow Up Recommendations No OT follow up;Supervision - Intermittent  OT Equipment Tub/shower seat  Individuals Consulted  Consulted and Agree with Results and Recommendations Patient  Acute Rehab OT Goals  Patient Stated Goal to get stronger and go home to his wife  OT Goal Formulation With patient  Time For Goal Achievement 01/15/19  Potential to Achieve Goals Good  OT Time Calculation  OT Start Time (ACUTE ONLY) 1213  OT Stop Time (ACUTE ONLY) 1305  OT Time Calculation (min) 52 min  OT General Charges  $OT Visit 1 Visit  OT Evaluation  $OT Eval Moderate Complexity 1 Mod  OT Treatments  $Self Care/Home Management  23-37 mins  Written Expression  Dominant Hand Right

## 2019-01-01 NOTE — Progress Notes (Signed)
Pt unable to tolerate prone position.

## 2019-01-01 NOTE — Progress Notes (Signed)
SLP Note  Patient Details Name: Brian Wilkinson MRN: [id number] DOB: May 29, 1966   Speech-Language Pathology Contact Note:   Due to COVID-19 isolation restrictions, SLP conducted an indirect screening of swallowing function via discussion with RN and direct interview with patient by phone  Findings: SLP assisted RN in administering 3 oz water swallow procedure over the phone to screen pt for aspiration risk. Pt easily consumed 3 oz of water without stopping or coughing per RN. Vocal quality audibly clear and dry over the phone. Given good clinical indicators for airway protection advised RN to given sips for a few hours and advance diet if well tolerated. Per chart review since 5/2, pt has tolerated diet and is transferring to step down. Will discontinue swallow orders and sign off.   Recommendations:    Diet: Regular solids and Thin liquids  Medications: Whole with liquids  Supervision: Full supervision  Strategies: Small bites/sips and Sit upright  SLP services will follow for safety with PO diet via chart screen  and sign off. Please do not hesitate to re-order our service if patient's swallowing function deteriorates.         Harlon Ditty, MA CCC-SLP  Acute Rehabilitation Services Pager [phone] Office [phone]   Claudine Mouton 01/01/2019, 8:07 AM

## 2019-01-01 NOTE — Progress Notes (Signed)
Called and updated daughter Kenney Houseman, answered all questions and explained that she would be primary contact and that she would need to update other family members and she would get one update on day shift by noon and one by night shift by 10pm and if any major changes occur.

## 2019-01-01 NOTE — Progress Notes (Signed)
PROGRESS NOTE                                                                                                                                                                                                             Patient Demographics:    Brian Wilkinson, is a 53 y.o. male, DOB - 06-12-1966, DXI:[id number]  Outpatient Primary MD for the patient is [facility], [facility]    LOS - 2  CC - SOB     Brief Narrative  Brian Wilkinson is a 53 y.o. male with no significant medical history who presented to [facility] with progressive dyspnea which had progressed constantly and was severe, only modestly better with NRB en route to ED. He was not able to speak in complete sentences due to shortness of breath. In the ED he was intubated and found to be covid-positive with elevated inflammatory markers, leukopenia, and streaky bilateral opacities on CXR consistent with pneumonia. Procalcitonin and lactic acid were normal. Admission to [facility] was requested. Oxygenation improved and he is extubated 5/2.   Subjective:    Brian Wilkinson today has, No headache, No chest pain, No abdominal pain - No Nausea, No new weakness tingling or numbness, Mild Cough & SOB.     Assessment  & Plan :    Acute respiratory disease due to COVID-19 virus -   ARDS due to covid-19 infection:ETT 5/1 - 5/2.  Received actemra 5/1, clinically overall stable. Still on 4lit/Kelley, placed on steroids, advance activity, trend markers, encouraged to prone daily.   COVID-19 Labs  Recent Labs    12/30/18 0529 12/31/18 0700 01/01/19 0350  DDIMER  --  0.87* 0.70*  FERRITIN 1,306* 1,080* 784*  LDH 239* 239* 221*  CRP 19.1* 12.1* 7.6*     Troponin elevation: Suspect demand ischemia due to covid infection, Trop trending downward, ECG with sinus bradycardia without ischemic features. No chest pain.  Started on aspirin, outpatient work up.      Condition - Extremely  Guarded  Family Communication  :  None  Code Status :  Full  Diet : Regular  Disposition Plan  :  TBD  Consults  :  None  Procedures  :    DVT Prophylaxis  :  Lovenox    Lab Results  Component Value Date   PLT 241 01/01/2019  Inpatient Medications  Scheduled Meds: . [START ON 01/02/2019] aspirin  325 mg Oral Daily  . chlorhexidine gluconate (MEDLINE KIT)  15 mL Mouth Rinse BID  . enoxaparin (LOVENOX) injection  40 mg Subcutaneous Daily  . famotidine  20 mg Oral BID  . methylPREDNISolone (SOLU-MEDROL) injection  80 mg Intravenous Q12H   Continuous Infusions: . sodium chloride Stopped (01/01/19 1120)   PRN Meds:.sodium chloride, menthol-cetylpyridinium, phenol  Antibiotics  :    Anti-infectives (From admission, onward)   None       Time Spent in minutes  30   Lala Lund M.D on 01/01/2019 at 12:14 PM  To page go to [email or web address] - password [email or web address]  [facility] -  Office  [phone]  See all Orders from today for further details   Admit date - 12/30/2018    2    Objective:   Vitals:   01/01/19 0500 01/01/19 0800 01/01/19 1200 01/01/19 1205  BP:  127/82  138/87  Pulse:  63  64  Resp:  (!) 27  (!) 26  Temp:  98.1 F (36.7 C) 98.2 F (36.8 C)   TempSrc:  Oral Oral   SpO2:  98%  97%  Weight: 79 kg     Height:        Wt Readings from Last 3 Encounters:  01/01/19 79 kg  12/30/18 73.5 kg  12/22/18 73.5 kg     Intake/Output Summary (Last 24 hours) at 01/01/2019 1214 Last data filed at 01/01/2019 1200 Gross per 24 hour  Intake 867.73 ml  Output 775 ml  Net 92.73 ml     Physical Exam  Awake Alert, Oriented X 3, No new F.N deficits, Normal affect Celebration.AT,PERRAL Supple Neck,No JVD, No cervical lymphadenopathy appriciated.  Symmetrical Chest wall movement, Good air movement bilaterally, CTAB RRR,No Gallops,Rubs or new Murmurs, No Parasternal Heave +ve B.Sounds, Abd Soft, No tenderness, No organomegaly appriciated, No rebound - guarding  or rigidity. No Cyanosis, Clubbing or edema, No new Rash or bruise       Data Review:    CBC Recent Labs  Lab 12/30/18 0529 12/30/18 1415 12/31/18 0700 01/01/19 0350  WBC 3.7*  --  2.4* 2.8*  HGB 12.6* 9.2* 11.7* 11.8*  HCT 37.5* 27.0* 35.0* 35.6*  PLT 152  --  172 241  MCV 94.7  --  97.2 96.5  MCH 31.8  --  32.5 32.0  MCHC 33.6  --  33.4 33.1  RDW 11.6  --  11.9 11.4*  LYMPHSABS 0.7  --  0.5* 0.5*  MONOABS 0.2  --  0.2 0.2  EOSABS 0.0  --  0.0 0.1  BASOSABS 0.0  --  0.0 0.0    Chemistries  Recent Labs  Lab 12/30/18 0529 12/30/18 1415 12/30/18 1600 12/31/18 0700 01/01/19 0350  NA 134* 137  --  137 138  K 3.8 4.2  --  3.5 3.5  CL 97*  --   --  100 100  CO2 25  --   --  27 27  GLUCOSE 133*  --   --  149* 98  BUN 14  --   --  18 13  CREATININE 0.74  --   --  0.61 0.61  CALCIUM 8.0*  --   --  8.1* 8.4*  MG  --   --  2.4 2.0 2.1  AST 31  --   --  37 31  ALT 19  --   --  23 24  ALKPHOS 59  --   --  52 52  BILITOT 0.9  --   --  0.5 0.3   ------------------------------------------------------------------------------------------------------------------ Recent Labs    12/31/18 0700 01/01/19 0350  TRIG 251* 197*    Lab Results  Component Value Date   HGBA1C 5.6 12/31/2018   ------------------------------------------------------------------------------------------------------------------ No results for input(s): TSH, T4TOTAL, T3FREE, THYROIDAB in the last 72 hours.  Invalid input(s): FREET3  Cardiac Enzymes Recent Labs  Lab 12/30/18 1600 12/30/18 2305 12/31/18 0700  TROPONINI 0.37* 0.21* 0.11*   ------------------------------------------------------------------------------------------------------------------ No results found for: BNP  Micro Results Recent Results (from the past 240 hour(s))  SARS Coronavirus 2 [facility] order, Performed in [facility] lab)     Status: Abnormal   Collection Time: 12/22/18 11:28 PM  Result Value Ref Range  Status   SARS Coronavirus 2 POSITIVE (A) NEGATIVE Final    Comment: RCRV ASHLEY SMITH RN 12/23/2018 @ 0105 RDW Performed at [facility], [address] [place], [place] [address]   Blood Culture (routine x 2)     Status: None (Preliminary result)   Collection Time: 12/30/18  5:28 AM  Result Value Ref Range Status   Specimen Description BLOOD LEFT ANTECUBITAL  Final   Special Requests   Final    BOTTLES DRAWN AEROBIC AND ANAEROBIC Blood Culture adequate volume   Culture   Final    NO GROWTH 2 DAYS Performed at [facility], [address] [place], [place] [address]    Report Status PENDING  Incomplete  Blood Culture (routine x 2)     Status: None (Preliminary result)   Collection Time: 12/30/18  5:28 AM  Result Value Ref Range Status   Specimen Description BLOOD BLOOD LEFT FOREARM  Final   Special Requests   Final    BOTTLES DRAWN AEROBIC AND ANAEROBIC Blood Culture adequate volume   Culture   Final    NO GROWTH 2 DAYS Performed at [facility], [address] [place], [place] [address]    Report Status PENDING  Incomplete  MRSA PCR Screening     Status: None   Collection Time: 12/30/18  1:15 PM  Result Value Ref Range Status   MRSA by PCR NEGATIVE NEGATIVE Final    Comment:        The GeneXpert MRSA Assay (FDA approved for NASAL specimens only), is one component of a comprehensive MRSA colonization surveillance program. It is not intended to diagnose MRSA infection nor to guide or monitor treatment for MRSA infections. Performed at [facility], [place] [address], [place], [place] [address]     Radiology Reports Dg Chest Port 1 View  Result Date: 12/31/2018 CLINICAL DATA:  Acute respiratory failure. EXAM: PORTABLE CHEST 1 VIEW COMPARISON:  Chest x-ray from yesterday. FINDINGS: Endotracheal tube tip 2.0 cm above the carina. Unchanged enteric tube. Stable cardiomediastinal silhouette. Normal pulmonary vascularity.  Persistent low lung volumes with relatively unchanged patchy asymmetric basilar predominant opacities. No pleural effusion or pneumothorax. No acute osseous abnormality. IMPRESSION: 1. Unchanged bibasilar pneumonia. Electronically Signed   By: Titus Dubin M.D.   On: 12/31/2018 05:49   Dg Chest Port 1 View  Result Date: 12/30/2018 CLINICAL DATA:  Respiratory distress EXAM: PORTABLE CHEST 1 VIEW COMPARISON:  12/22/2018 FINDINGS: Endotracheal tube with tip 1.5 cm above the carina. The orogastric tube tip is in the gastric fundus. Low volume chest with streaky and hazy opacity on both sides. No Kerley lines, effusion, or pneumothorax. Normal heart size. IMPRESSION: 1. Endotracheal tube with tip 15 mm above  the carina. 2. The orogastric tube tip is in the stomach. 3. Positive COVID test with bilateral pneumonia. Electronically Signed   By: Monte Fantasia M.D.   On: 12/30/2018 06:06   Dg Chest Port 1 View  Result Date: 12/22/2018 CLINICAL DATA:  Chills, cough EXAM: PORTABLE CHEST 1 VIEW COMPARISON:  None FINDINGS: Linear atelectasis or scarring at the left base. No confluent opacity on the right. Heart is upper limits normal in size. No effusions or acute bony abnormality. IMPRESSION: Left base scarring or atelectasis. Electronically Signed   By: Rolm Baptise M.D.   On: 12/22/2018 22:35

## 2019-01-02 DIAGNOSIS — U071 COVID-19: Secondary | ICD-10-CM | POA: Diagnosis not present

## 2019-01-02 DIAGNOSIS — J069 Acute upper respiratory infection, unspecified: Secondary | ICD-10-CM | POA: Diagnosis not present

## 2019-01-02 LAB — COMPREHENSIVE METABOLIC PANEL
ALT: 80 U/L — ABNORMAL HIGH (ref 0–44)
AST: 89 U/L — ABNORMAL HIGH (ref 15–41)
Albumin: 3.2 g/dL — ABNORMAL LOW (ref 3.5–5.0)
Alkaline Phosphatase: 54 U/L (ref 38–126)
Anion gap: 8 (ref 5–15)
BUN: 17 mg/dL (ref 6–20)
CO2: 26 mmol/L (ref 22–32)
Calcium: 8.7 mg/dL — ABNORMAL LOW (ref 8.9–10.3)
Chloride: 104 mmol/L (ref 98–111)
Creatinine, Ser: 0.68 mg/dL (ref 0.61–1.24)
GFR calc Af Amer: >60 mL/min (ref >60)
GFR calc non Af Amer: >60 mL/min (ref >60)
Glucose, Bld: 134 mg/dL — ABNORMAL HIGH (ref 70–99)
Potassium: 4.2 mmol/L (ref 3.5–5.1)
Sodium: 138 mmol/L (ref 135–145)
Total Bilirubin: 0.5 mg/dL (ref 0.3–1.2)
Total Protein: 7 g/dL (ref 6.5–8.1)

## 2019-01-02 LAB — CBC WITH DIFFERENTIAL/PLATELET
Abs Immature Granulocytes: 0.03 10*3/uL (ref 0.00–0.07)
Basophils Absolute: 0 10*3/uL (ref 0.0–0.1)
Basophils Relative: 0 %
Eosinophils Absolute: 0 10*3/uL (ref 0.0–0.5)
Eosinophils Relative: 0 %
HCT: 35.7 % — ABNORMAL LOW (ref 39.0–52.0)
Hemoglobin: 12.1 g/dL — ABNORMAL LOW (ref 13.0–17.0)
Immature Granulocytes: 1 %
Lymphocytes Relative: 13 %
Lymphs Abs: 0.4 10*3/uL — ABNORMAL LOW (ref 0.7–4.0)
MCH: 32.4 pg (ref 26.0–34.0)
MCHC: 33.9 g/dL (ref 30.0–36.0)
MCV: 95.7 fL (ref 80.0–100.0)
Monocytes Absolute: 0.1 10*3/uL (ref 0.1–1.0)
Monocytes Relative: 3 %
Neutro Abs: 2.5 10*3/uL (ref 1.7–7.7)
Neutrophils Relative %: 83 %
Platelets: 299 10*3/uL (ref 150–400)
RBC: 3.73 MIL/uL — ABNORMAL LOW (ref 4.22–5.81)
RDW: 11.4 % — ABNORMAL LOW (ref 11.5–15.5)
WBC: 3.1 10*3/uL — ABNORMAL LOW (ref 4.0–10.5)
nRBC: 0 % (ref 0.0–0.2)

## 2019-01-02 LAB — PHOSPHORUS: Phosphorus: 4.6 mg/dL (ref 2.5–4.6)

## 2019-01-02 LAB — TRIGLYCERIDES: Triglycerides: 127 mg/dL (ref <150)

## 2019-01-02 LAB — LACTATE DEHYDROGENASE: LDH: 208 U/L — ABNORMAL HIGH (ref 98–192)

## 2019-01-02 LAB — FERRITIN: Ferritin: 793 ng/mL — ABNORMAL HIGH (ref 24–336)

## 2019-01-02 LAB — D-DIMER, QUANTITATIVE (NOT AT ARMC): D-Dimer, Quant: 0.66 ug/mL-FEU — ABNORMAL HIGH (ref 0.00–0.50)

## 2019-01-02 LAB — C-REACTIVE PROTEIN: CRP: 4.2 mg/dL — ABNORMAL HIGH (ref <1.0)

## 2019-01-02 LAB — MAGNESIUM: Magnesium: 2.2 mg/dL (ref 1.7–2.4)

## 2019-01-02 MED ORDER — ENSURE ENLIVE PO LIQD
237.0000 mL | Freq: Two times a day (BID) | ORAL | Status: DC
  Administered 2019-01-02 – 2019-01-04 (×4): 237 mL via ORAL

## 2019-01-02 MED ORDER — ATORVASTATIN CALCIUM 40 MG PO TABS
40.0000 mg | ORAL_TABLET | Freq: Every day | ORAL | Status: DC
  Administered 2019-01-02 – 2019-01-03 (×2): 40 mg via ORAL
  Filled 2019-01-02 (×2): qty 1

## 2019-01-02 NOTE — Progress Notes (Signed)
PROGRESS NOTE                                                                                                                                                                                                             Patient Demographics:    Brian Wilkinson, is a 53 y.o. male, DOB - May 28, 1966, IWL:[id number]  Outpatient Primary MD for the patient is [facility] [place]    LOS - 3  CC - SOB     Brief Narrative  Brian Wilkinson is a 53 y.o. male with no significant medical history who presented to [facility] with progressive dyspnea which had progressed constantly and was severe, only modestly better with NRB en route to ED. He was not able to speak in complete sentences due to shortness of breath. In the ED he was intubated and found to be covid-positive with elevated inflammatory markers, leukopenia, and streaky bilateral opacities on CXR consistent with pneumonia. Procalcitonin and lactic acid were normal. Admission to [facility] was requested. Oxygenation improved and he is extubated 5/2.   Subjective:   Patient in bed, appears comfortable, denies any headache, no fever, no chest pain or pressure, no shortness of breath , no abdominal pain. No focal weakness.   Assessment  & Plan :    Acute respiratory disease due to COVID-19 virus -   developed acute hypoxic respiratory failure due to COVID-19 infection requiring brief endotracheal intubation on 12/30/2018, he was extubated 12/31/2018, also received Actemra on 12/30/2018, clinically improving, currently remains on 4 L nasal cannula oxygen but will try and titrated down, encouraged to remain in chair, flutter valve I-S in place for pulmonary toiletry.  When in bed encouraged to prone.  Inflammatory markers trending towards stability.    COVID-19 Labs  Recent Labs    12/31/18 0700 01/01/19 0350 01/02/19 0410  DDIMER 0.87* 0.70* 0.66*  FERRITIN 1,080* 784* 793*  LDH 239*  221* 208*  CRP 12.1* 7.6* 4.2*     Troponin elevation: Suspect demand ischemia due to covid infection, Trop trending downward, ECG with sinus bradycardia without ischemic features. No chest pain.  Start him on aspirin along with statin, outpatient work up including echocardiogram will be required post discharge.      Condition - Extremely Guarded  Family Communication  :  None  Code Status :  Full  Diet : Regular  Disposition Plan  :  TBD  Consults  :  None  Procedures  :    DVT Prophylaxis  :  Lovenox    Lab Results  Component Value Date   PLT 299 01/02/2019    Inpatient Medications  Scheduled Meds: . aspirin  325 mg Oral Daily  . chlorhexidine gluconate (MEDLINE KIT)  15 mL Mouth Rinse BID  . enoxaparin (LOVENOX) injection  40 mg Subcutaneous Daily  . famotidine  20 mg Oral BID  . methylPREDNISolone (SOLU-MEDROL) injection  80 mg Intravenous Q12H   Continuous Infusions: . sodium chloride Stopped (01/01/19 1120)   PRN Meds:.sodium chloride, menthol-cetylpyridinium, phenol  Antibiotics  :    Anti-infectives (From admission, onward)   None       Time Spent in minutes  30   Lala Lund M.D on 01/02/2019 at 9:12 AM  To page go to [email or web address] - password [email or web address]  [facility] -  Office  [phone]  See all Orders from today for further details   Admit date - 12/30/2018    3    Objective:   Vitals:   01/02/19 0100 01/02/19 0228 01/02/19 0500 01/02/19 0623  BP:  127/87  124/71  Pulse: (!) 54  (!) 57   Resp: (!) 25  (!) 22   Temp:  98.3 F (36.8 C)  98.1 F (36.7 C)  TempSrc:  Oral  Oral  SpO2: 99% 97% 95%   Weight:      Height:        Wt Readings from Last 3 Encounters:  01/01/19 79 kg  12/30/18 73.5 kg  12/22/18 73.5 kg     Intake/Output Summary (Last 24 hours) at 01/02/2019 0912 Last data filed at 01/02/2019 0228 Gross per 24 hour  Intake 532.12 ml  Output 175 ml  Net 357.12 ml     Physical Exam  Awake Alert, Oriented X  3, No new F.N deficits, Normal affect .AT,PERRAL Supple Neck,No JVD, No cervical lymphadenopathy appriciated.  Symmetrical Chest wall movement, Good air movement bilaterally, CTAB RRR,No Gallops, Rubs or new Murmurs, No Parasternal Heave +ve B.Sounds, Abd Soft, No tenderness, No organomegaly appriciated, No rebound - guarding or rigidity. No Cyanosis, Clubbing or edema, No new Rash or bruise    Data Review:    CBC Recent Labs  Lab 12/30/18 0529 12/30/18 1415 12/31/18 0700 01/01/19 0350 01/02/19 0410  WBC 3.7*  --  2.4* 2.8* 3.1*  HGB 12.6* 9.2* 11.7* 11.8* 12.1*  HCT 37.5* 27.0* 35.0* 35.6* 35.7*  PLT 152  --  172 241 299  MCV 94.7  --  97.2 96.5 95.7  MCH 31.8  --  32.5 32.0 32.4  MCHC 33.6  --  33.4 33.1 33.9  RDW 11.6  --  11.9 11.4* 11.4*  LYMPHSABS 0.7  --  0.5* 0.5* 0.4*  MONOABS 0.2  --  0.2 0.2 0.1  EOSABS 0.0  --  0.0 0.1 0.0  BASOSABS 0.0  --  0.0 0.0 0.0    Chemistries  Recent Labs  Lab 12/30/18 0529 12/30/18 1415 12/30/18 1600 12/31/18 0700 01/01/19 0350 01/02/19 0410  NA 134* 137  --  137 138 138  K 3.8 4.2  --  3.5 3.5 4.2  CL 97*  --   --  100 100 104  CO2 25  --   --  '27 27 26  ' GLUCOSE 133*  --   --  149* 98 134*  BUN 14  --   --  '18 13 17  ' CREATININE 0.74  --   --  0.61 0.61 0.68  CALCIUM 8.0*  --   --  8.1* 8.4* 8.7*  MG  --   --  2.4 2.0 2.1 2.2  AST 31  --   --  37 31 89*  ALT 19  --   --  23 24 80*  ALKPHOS 59  --   --  52 52 54  BILITOT 0.9  --   --  0.5 0.3 0.5   ------------------------------------------------------------------------------------------------------------------ Recent Labs    01/01/19 0350 01/02/19 0410  TRIG 197* 127    Lab Results  Component Value Date   HGBA1C 5.6 12/31/2018   ------------------------------------------------------------------------------------------------------------------ No results for input(s): TSH, T4TOTAL, T3FREE, THYROIDAB in the last 72 hours.  Invalid input(s): FREET3  Cardiac  Enzymes Recent Labs  Lab 12/30/18 1600 12/30/18 2305 12/31/18 0700  TROPONINI 0.37* 0.21* 0.11*   ------------------------------------------------------------------------------------------------------------------ No results found for: BNP  Micro Results Recent Results (from the past 240 hour(s))  Blood Culture (routine x 2)     Status: None (Preliminary result)   Collection Time: 12/30/18  5:28 AM  Result Value Ref Range Status   Specimen Description BLOOD LEFT ANTECUBITAL  Final   Special Requests   Final    BOTTLES DRAWN AEROBIC AND ANAEROBIC Blood Culture adequate volume   Culture   Final    NO GROWTH 3 DAYS Performed at [facility], [address] [place], [place] [address]    Report Status PENDING  Incomplete  Blood Culture (routine x 2)     Status: None (Preliminary result)   Collection Time: 12/30/18  5:28 AM  Result Value Ref Range Status   Specimen Description BLOOD BLOOD LEFT FOREARM  Final   Special Requests   Final    BOTTLES DRAWN AEROBIC AND ANAEROBIC Blood Culture adequate volume   Culture   Final    NO GROWTH 3 DAYS Performed at [facility], [address] [place], [place] [address]    Report Status PENDING  Incomplete  MRSA PCR Screening     Status: None   Collection Time: 12/30/18  1:15 PM  Result Value Ref Range Status   MRSA by PCR NEGATIVE NEGATIVE Final    Comment:        The GeneXpert MRSA Assay (FDA approved for NASAL specimens only), is one component of a comprehensive MRSA colonization surveillance program. It is not intended to diagnose MRSA infection nor to guide or monitor treatment for MRSA infections. Performed at [facility], [place] [address], [place], [place] [address]     Radiology Reports Dg Chest Port 1 View  Result Date: 12/31/2018 CLINICAL DATA:  Acute respiratory failure. EXAM: PORTABLE CHEST 1 VIEW COMPARISON:  Chest x-ray from yesterday. FINDINGS: Endotracheal tube tip 2.0 cm  above the carina. Unchanged enteric tube. Stable cardiomediastinal silhouette. Normal pulmonary vascularity. Persistent low lung volumes with relatively unchanged patchy asymmetric basilar predominant opacities. No pleural effusion or pneumothorax. No acute osseous abnormality. IMPRESSION: 1. Unchanged bibasilar pneumonia. Electronically Signed   By: Titus Dubin M.D.   On: 12/31/2018 05:49   Dg Chest Port 1 View  Result Date: 12/30/2018 CLINICAL DATA:  Respiratory distress EXAM: PORTABLE CHEST 1 VIEW COMPARISON:  12/22/2018 FINDINGS: Endotracheal tube with tip 1.5 cm above the carina. The orogastric tube tip is in the gastric fundus. Low volume chest with streaky and hazy opacity on both sides. No Kerley lines, effusion, or pneumothorax. Normal heart size.  IMPRESSION: 1. Endotracheal tube with tip 15 mm above the carina. 2. The orogastric tube tip is in the stomach. 3. Positive COVID test with bilateral pneumonia. Electronically Signed   By: Monte Fantasia M.D.   On: 12/30/2018 06:06   Dg Chest Port 1 View  Result Date: 12/22/2018 CLINICAL DATA:  Chills, cough EXAM: PORTABLE CHEST 1 VIEW COMPARISON:  None FINDINGS: Linear atelectasis or scarring at the left base. No confluent opacity on the right. Heart is upper limits normal in size. No effusions or acute bony abnormality. IMPRESSION: Left base scarring or atelectasis. Electronically Signed   By: Rolm Baptise M.D.   On: 12/22/2018 22:35

## 2019-01-02 NOTE — Evaluation (Addendum)
Physical Therapy Evaluation Patient Details Name: Brian Wilkinson MRN: [id number] DOB: 11/28/1965 Today's Date: 01/02/2019   History of Present Illness  53 y.o. male with no significant medical history who presented to [facility] with progressive dyspnea which had progressed constantly and was severe, only modestly better with NRB en route to ED. He was not able to speak in complete sentences due to shortness of breath. In the ED he was intubated and found to be covid-positive with elevated inflammatory markers, leukopenia, and streaky bilateral opacities on CXR consistent with pneumonia. Procalcitonin and lactic acid were normal. Admission to [facility] was requested. Oxygenation improved and he is extubated 5/2.  Clinical Impression  Patent is mobile physically. Ambulated x 80' on RA with O2 sats dropping to 83%. Placed on 3 liters, 90% after 3 minutes rest. HR 73-90, RR 33. Continue to monitor O2 sats on/off RA. Pt admitted with above diagnosis. Pt currently with functional limitations due to the deficits listed below (see PT Problem List). Pt will benefit from skilled PT to increase their independence and safety with mobility to allow discharge to the venue listed below.   PT will ambulate on O2 for saturation qulifiation needs. After pt eats breakfast.    Follow Up Recommendations No PT follow up    Equipment Recommendations  None recommended by PT    Recommendations for Other Services       Precautions / Restrictions Precautions Precaution Comments: monitor vitals, dropped sats on RA      Mobility  Bed Mobility Overal bed mobility: Independent                Transfers Overall transfer level: Independent                  Ambulation/Gait Ambulation/Gait assistance: Supervision Gait Distance (Feet): 80 Feet Assistive device: None Gait Pattern/deviations: WFL(Within Functional Limits)     General Gait Details: slow speed., cautious, encouraged Pused lip breaths  Stairs             Wheelchair Mobility    Modified Rankin (Stroke Patients Only)       Balance     Sitting balance-Leahy Scale: Good       Standing balance-Leahy Scale: Good                               Pertinent Vitals/Pain Pain Assessment: No/denies pain    Home Living Family/patient expects to be discharged to:: Private residence Living Arrangements: Spouse/significant other;Children Available Help at Discharge: Family;Available 24 hours/day Type of Home: House Home Access: Stairs to enter Entrance Stairs-Rails: Left Entrance Stairs-Number of Steps: 5 Home Layout: One level Home Equipment: None      Prior Function Level of Independence: Independent         Comments: drives; landscaper; works for [employer]   Dominant Hand: Right    Extremity/Trunk Assessment   Upper Extremity Assessment Upper Extremity Assessment: Overall WFL for tasks assessed    Lower Extremity Assessment Lower Extremity Assessment: Overall WFL for tasks assessed    Cervical / Trunk Assessment Cervical / Trunk Assessment: Normal  Communication   Communication: Prefers language other than English(understands ansd speaks [place])  Cognition Arousal/Alertness: Awake/alert Behavior During Therapy: WFL for tasks assessed/performed Overall Cognitive Status: Within Functional Limits for tasks assessed  General Comments      Exercises     Assessment/Plan    PT Assessment Patient needs continued PT services  PT Problem List Decreased activity tolerance;Cardiopulmonary status limiting activity       PT Treatment Interventions Gait training    PT Goals (Current goals can be found in the Care Plan section)  Acute Rehab PT Goals Patient Stated Goal: to get stronger and go home to his wife PT Goal Formulation: With patient Time For Goal Achievement: 01/16/19 Potential to Achieve Goals:  Good    Frequency Min 4X/week   Barriers to discharge        Co-evaluation               AM-PAC PT "6 Clicks" Mobility  Outcome Measure Help needed turning from your back to your side while in a flat bed without using bedrails?: None Help needed moving from lying on your back to sitting on the side of a flat bed without using bedrails?: None Help needed moving to and from a bed to a chair (including a wheelchair)?: None Help needed standing up from a chair using your arms (e.g., wheelchair or bedside chair)?: None Help needed to walk in hospital room?: A Little Help needed climbing 3-5 steps with a railing? : A Little 6 Click Score: 22    End of Session   Activity Tolerance: Treatment limited secondary to medical complications (Comment);Patient tolerated treatment well Patient left: in chair;with call bell/phone within reach Nurse Communication: Mobility status PT Visit Diagnosis: Difficulty in walking, not elsewhere classified (R26.2)    Time: [id number] PT Time Calculation (min) (ACUTE ONLY): 15 min   Charges:   PT Evaluation $PT Eval Low Complexity: 1 Low          Blanchard KelchKaren Cody Albus PT Acute Rehabilitation Services Pager [phone] Office [phone]   Rada HayHill, Brian Wilkinson 01/02/2019, 8:29 AM

## 2019-01-02 NOTE — Progress Notes (Signed)
Nutrition Follow-up RD working remotely.  DOCUMENTATION CODES:   Not applicable  INTERVENTION:   Ensure Enlive po BID, each supplement provides 350 kcal and 20 grams of protein  Continue to supplement diet: Mighty Shake II TID with Breakfast, each supplement provides 480-500 kcals and 20-23 grams of protein Magic cup TID with lunch and dinner, each supplement provides 290 kcal and 9 grams of protein  Encourage PO intake  NUTRITION DIAGNOSIS:   Inadequate oral intake related to acute illness as evidenced by (meal completion varies).  GOAL:   Patient will meet greater than or equal to 90% of their needs  MONITOR:   PO intake, Supplement acceptance  REASON FOR ASSESSMENT:   Ventilator    ASSESSMENT:   53 yo male who was diagnosed with COVID-19 on 4/24. He returned to the ED with progressive respiratory distress r/t bilateral PNA requiring intubation on admission.  5/2 extubated Meal Completion: 25-100% 100% recorded this am for breakfast Pt working with therapies.   Labs and medications reviewed.   NUTRITION - FOCUSED PHYSICAL EXAM:  uanble to complete-working remotely  Diet Order:   Diet Order            DIET SOFT Room service appropriate? Yes with Assist; Fluid consistency: Thin  Diet effective now              EDUCATION NEEDS:   No education needs have been identified at this time  Skin:  Skin Assessment: Reviewed RN Assessment  Last BM:  5/1  Height:   Ht Readings from Last 1 Encounters:  12/30/18 5\' 4"  (1.626 m)    Weight:   Wt Readings from Last 1 Encounters:  01/01/19 79 kg    Ideal Body Weight:  59.1 kg  BMI:  Body mass index is 29.88 kg/m.  Estimated Nutritional Needs:   Kcal:  1800-2000  Protein:  90-110 grams  Fluid:  1.8 L  Kendell Bane RD, LDN, CNSC [phone] Pager [phone] After Hours Pager

## 2019-01-02 NOTE — Progress Notes (Signed)
Physical Therapy Treatment Patient Details Name: Brian Wilkinson MRN: [id number] DOB: 16-Jan-1966 Today's Date: 01/02/2019    History of Present Illness 53 y.o. male with no significant medical history who presented to [facility] with progressive dyspnea which had progressed constantly and was severe, only modestly better with NRB en route to ED. He was not able to speak in complete sentences due to shortness of breath. In the ED he was intubated and found to be covid-positive with elevated inflammatory markers, leukopenia, and streaky bilateral opacities on CXR consistent with pneumonia. Procalcitonin and lactic acid were normal. Admission to [facility] was requested. Oxygenation improved and he is extubated 5/2.    PT Comments    The patient  Started on 2 L [place] with sats 99%, decreased to RA while ambulating with drop tp 87%, returned to 2 L with sats returning to > 90. The  Patient tolerated a much longer distance for ambulation with  RA but required O2 to keep sats  Up. Continue to monitor for oxygen needs aat DC. Patient reports that he will have to stay in his outbuilding which is not airconditioned and has no bed  When DC'd due to virus precautions.Continue PT for ambulation.  Follow Up Recommendations  No PT follow up     Equipment Recommendations  None recommended by PT    Recommendations for Other Services       Precautions / Restrictions Precautions Precaution Comments: monitor vitals, dropped sats on RA Restrictions Weight Bearing Restrictions: No    Mobility  Bed Mobility               General bed mobility comments: In recliner upon arrival  Transfers Overall transfer level: Independent               General transfer comment: Increased time and effort  Ambulation/Gait Ambulation/Gait assistance: Supervision Gait Distance (Feet): 280 Feet Assistive device: None Gait Pattern/deviations: WFL(Within Functional Limits)     General Gait Details: slow speed., cautious,     Stairs             Wheelchair Mobility    Modified Rankin (Stroke Patients Only)       Balance Overall balance assessment: Independent   Sitting balance-Leahy Scale: Good       Standing balance-Leahy Scale: Good                              Cognition Arousal/Alertness: Awake/alert Behavior During Therapy: WFL for tasks assessed/performed Overall Cognitive Status: Within Functional Limits for tasks assessed                                 General Comments: Very motivated to participate in therapy and return to PLOF. Concerned about his wife and two children (ages 64 and 43)      Exercises Other Exercises Other Exercises: encouraged use of incentive spirometer    General Comments      Pertinent Vitals/Pain Pain Assessment: No/denies pain    Home Living                      Prior Function            PT Goals (current goals can now be found in the care plan section) Acute Rehab PT Goals Patient Stated Goal: to get stronger and go home to his wife Progress towards PT  goals: Progressing toward goals    Frequency    Min 5X/week      PT Plan Current plan remains appropriate;Frequency needs to be updated    Co-evaluation              AM-PAC PT "6 Clicks" Mobility   Outcome Measure  Help needed turning from your back to your side while in a flat bed without using bedrails?: None Help needed moving from lying on your back to sitting on the side of a flat bed without using bedrails?: None Help needed moving to and from a bed to a chair (including a wheelchair)?: None Help needed standing up from a chair using your arms (e.g., wheelchair or bedside chair)?: None Help needed to walk in hospital room?: A Little Help needed climbing 3-5 steps with a railing? : A Little 6 Click Score: 22    End of Session   Activity Tolerance: Patient tolerated treatment well Patient left: in chair;with call bell/phone  within reach Nurse Communication: Mobility status PT Visit Diagnosis: Difficulty in walking, not elsewhere classified (R26.2)     Time: 1050-1110 PT Time Calculation (min) (ACUTE ONLY): 20 min  Charges:  $Gait Training: 8-22 mins                     Blanchard KelchKaren Vasil Juhasz PT Acute Rehabilitation Services Pager [phone] Office [phone]     Rada HayHill, Cruzito Standre Elizabeth 01/02/2019, 1:36 PM

## 2019-01-02 NOTE — Progress Notes (Addendum)
Occupational Therapy Treatment Patient Details Name: Brian ClimesLuis M Wilkinson MRN: [id number] DOB: [birth year] Today's Date: 01/02/2019    History of present illness 53 y.o. male with no significant medical history who presented to [facility] with progressive dyspnea which had progressed constantly and was severe, only modestly better with NRB en route to ED. He was not able to speak in complete sentences due to shortness of breath. In the ED he was intubated and found to be covid-positive with elevated inflammatory markers, leukopenia, and streaky bilateral opacities on CXR consistent with pneumonia. Procalcitonin and lactic acid were normal. Admission to [facility] was requested. Oxygenation improved and he is extubated 5/2.   OT comments  Pt progressing towards established OT goals. Pt performing functional mobility in hallway with Min Guard A for safety. Pt dropping to 82% on RA during functional mobility and required increased standing rest break and 3L O2 to return to 90-88%; cueing pt for purse lip breathing. Pt shaving and washing his face at the sink with supervision-Mod I. Set up pt for sponge bath at sink and notified RN. Continue to recommend dc home once medically stable per physician. Will continue to follow acutely as admitted.    Follow Up Recommendations  No OT follow up;Supervision - Intermittent    Equipment Recommendations  Tub/shower seat    Recommendations for Other Services      Precautions / Restrictions Precautions Precaution Comments: monitor vitals, dropped sats on RA Restrictions Weight Bearing Restrictions: No       Mobility Bed Mobility Overal bed mobility: Independent             General bed mobility comments: In recliner upon arrival  Transfers Overall transfer level: Modified independent               General transfer comment: Increased time and effort    Balance Overall balance assessment: Needs assistance   Sitting balance-Leahy Scale: Good        Standing balance-Leahy Scale: Good                             ADL either performed or assessed with clinical judgement   ADL Overall ADL's : Needs assistance/impaired     Grooming: Supervision/safety;Standing Grooming Details (indicate cue type and reason): Pt shaving at sink. No LOB and rest breaks.  Upper Body Bathing: Modified independent;Sitting;Standing Upper Body Bathing Details (indicate cue type and reason): Set up for sponge bath at sink (notified RN). Pt requiring increased time and placed BSC at sink for rest breaks as needed. Lower Body Bathing: Modified independent;Sit to/from stand Lower Body Bathing Details (indicate cue type and reason): Set up for sponge bath at sink (notified RN). Pt requiring increased time and placed BSC at sink for rest breaks as needed.                     Functional mobility during ADLs: Min guard General ADL Comments: Pt continues to fatigue but reports he is feeling better today.     Vision       Perception     Praxis      Cognition Arousal/Alertness: Awake/alert Behavior During Therapy: WFL for tasks assessed/performed Overall Cognitive Status: Within Functional Limits for tasks assessed                                 General Comments: Very motivated to  participate in therapy and return to PLOF. Concerned about his wife and two children (ages 91 and 31)        Exercises Exercises: Other exercises Other Exercises Other Exercises: encouraged use of incentive spirometer   Shoulder Instructions       General Comments SpO2 90-88% on RA. Dropping to 82% on RA during mobility. Placed on 3L O2 and required increased time and standing rest break for pt to return to 90-88%    Pertinent Vitals/ Pain       Pain Assessment: No/denies pain  Home Living Family/patient expects to be discharged to:: Private residence Living Arrangements: Spouse/significant other;Children Available Help at Discharge:  Family;Available 24 hours/day Type of Home: House Home Access: Stairs to enter [employer] of Steps: 5 Entrance Stairs-Rails: Left Home Layout: One level     Bathroom Shower/Tub: Tub/shower unit;Curtain         Home Equipment: None          Prior Functioning/Environment Level of Independence: Independent        Comments: drives; landscaper; 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        Progress Toward Goals  OT Goals(current goals can now be found in the care plan section)  Progress towards OT goals: Progressing toward goals  Acute Rehab OT Goals Patient Stated Goal: to get stronger and go home to his wife OT Goal Formulation: With patient Time For Goal Achievement: 01/15/19 Potential to Achieve Goals: Good ADL Goals Pt Will Perform Lower Body Bathing: with modified independence;sit to/from stand Pt Will Perform Lower Body Dressing: with modified independence;sit to/from stand Pt Will Transfer to Toilet: with modified independence;ambulating Pt Will Perform Toileting - Clothing Manipulation and hygiene: with modified independence;sit to/from stand Pt Will Perform Tub/Shower Transfer: Independently;Tub transfer;ambulating Pt/caregiver will Perform Home Exercise Program: Increased strength;Both right and left upper extremity;With written HEP provided;Independently Additional ADL Goal #1: Pt will independently verbalize 3 energy conservation strategies for ADL and IADL tasks  Plan Discharge plan remains appropriate    Co-evaluation                 AM-PAC OT "6 Clicks" Daily Activity     Outcome Measure   Help from another person eating meals?: None Help from another person taking care of personal grooming?: None Help from another person toileting, which includes using toliet, bedpan, or urinal?: A Little Help from another person bathing (including washing, rinsing, drying)?: A Little Help from another person to put on and  taking off regular upper body clothing?: None Help from another person to put on and taking off regular lower body clothing?: A Little 6 Click Score: 21    End of Session Equipment Utilized During Treatment: Oxygen(3L)  OT Visit Diagnosis: Unsteadiness on feet (R26.81);Muscle weakness (generalized) (M62.81)   Activity Tolerance Patient tolerated treatment well   Patient Left with call bell/phone within reach;in chair(at sink bathing)   Nurse Communication Mobility status;Other (comment)(Pt at sink bathing)        Time: 1025-8527 OT Time Calculation (min): 33 min  Charges: OT General Charges $OT Visit: 1 Visit OT Treatments $Self Care/Home Management : 23-37 mins  Cleston Lautner MSOT, OTR/L Acute Rehab Pager: [phone] Office: [phone]   Theodoro Grist Boden Stucky 01/02/2019, 10:51 AM

## 2019-01-02 NOTE — Progress Notes (Signed)
SATURATION QUALIFICATIONS: (This note is used to comply with regulatory documentation for home oxygen)  Patient Saturations on Room Air at Rest = 90-88%  Patient Saturations on Room Air while Ambulating = 82%  Patient Saturations on 3 Liters of oxygen while Ambulating = 90-88% after significant standing rest break  Please briefly explain why patient needs home oxygen: Pt presenting with decreased activity tolerance and requires supplemental O2 to perform functional mobility and basic ADLs.

## 2019-01-03 DIAGNOSIS — J069 Acute upper respiratory infection, unspecified: Secondary | ICD-10-CM | POA: Diagnosis not present

## 2019-01-03 DIAGNOSIS — U071 COVID-19: Secondary | ICD-10-CM | POA: Diagnosis not present

## 2019-01-03 LAB — CBC WITH DIFFERENTIAL/PLATELET
Abs Immature Granulocytes: 0.03 10*3/uL (ref 0.00–0.07)
Basophils Absolute: 0 10*3/uL (ref 0.0–0.1)
Basophils Relative: 0 %
Eosinophils Absolute: 0 10*3/uL (ref 0.0–0.5)
Eosinophils Relative: 0 %
HCT: 34.1 % — ABNORMAL LOW (ref 39.0–52.0)
Hemoglobin: 11.3 g/dL — ABNORMAL LOW (ref 13.0–17.0)
Immature Granulocytes: 0 %
Lymphocytes Relative: 8 %
Lymphs Abs: 0.6 10*3/uL — ABNORMAL LOW (ref 0.7–4.0)
MCH: 32 pg (ref 26.0–34.0)
MCHC: 33.1 g/dL (ref 30.0–36.0)
MCV: 96.6 fL (ref 80.0–100.0)
Monocytes Absolute: 0.2 10*3/uL (ref 0.1–1.0)
Monocytes Relative: 2 %
Neutro Abs: 6.3 10*3/uL (ref 1.7–7.7)
Neutrophils Relative %: 90 %
Platelets: 307 10*3/uL (ref 150–400)
RBC: 3.53 MIL/uL — ABNORMAL LOW (ref 4.22–5.81)
RDW: 11.5 % (ref 11.5–15.5)
WBC: 7.1 10*3/uL (ref 4.0–10.5)
nRBC: 0 % (ref 0.0–0.2)

## 2019-01-03 LAB — COMPREHENSIVE METABOLIC PANEL
ALT: 209 U/L — ABNORMAL HIGH (ref 0–44)
AST: 154 U/L — ABNORMAL HIGH (ref 15–41)
Albumin: 3.1 g/dL — ABNORMAL LOW (ref 3.5–5.0)
Alkaline Phosphatase: 54 U/L (ref 38–126)
Anion gap: 8 (ref 5–15)
BUN: 23 mg/dL — ABNORMAL HIGH (ref 6–20)
CO2: 28 mmol/L (ref 22–32)
Calcium: 8.6 mg/dL — ABNORMAL LOW (ref 8.9–10.3)
Chloride: 103 mmol/L (ref 98–111)
Creatinine, Ser: 0.7 mg/dL (ref 0.61–1.24)
GFR calc Af Amer: >60 mL/min (ref >60)
GFR calc non Af Amer: >60 mL/min (ref >60)
Glucose, Bld: 147 mg/dL — ABNORMAL HIGH (ref 70–99)
Potassium: 4.4 mmol/L (ref 3.5–5.1)
Sodium: 139 mmol/L (ref 135–145)
Total Bilirubin: 0.3 mg/dL (ref 0.3–1.2)
Total Protein: 6.6 g/dL (ref 6.5–8.1)

## 2019-01-03 LAB — LACTATE DEHYDROGENASE: LDH: 193 U/L — ABNORMAL HIGH (ref 98–192)

## 2019-01-03 LAB — TRIGLYCERIDES: Triglycerides: 86 mg/dL (ref <150)

## 2019-01-03 LAB — D-DIMER, QUANTITATIVE: D-Dimer, Quant: 0.5 ug/mL-FEU (ref 0.00–0.50)

## 2019-01-03 LAB — PHOSPHORUS: Phosphorus: 4 mg/dL (ref 2.5–4.6)

## 2019-01-03 LAB — MAGNESIUM: Magnesium: 2.2 mg/dL (ref 1.7–2.4)

## 2019-01-03 LAB — FERRITIN: Ferritin: 743 ng/mL — ABNORMAL HIGH (ref 24–336)

## 2019-01-03 LAB — C-REACTIVE PROTEIN: CRP: 2.2 mg/dL — ABNORMAL HIGH (ref <1.0)

## 2019-01-03 NOTE — Progress Notes (Signed)
Physical Therapy Treatment Patient Details Name: Brian Wilkinson MRN: [id number] DOB: December 04, 1965 Today's Date: 01/03/2019    History of Present Illness 53 y.o. male with no significant medical history who presented to [facility] with progressive dyspnea which had progressed constantly and was severe, only modestly better with NRB en route to ED. He was not able to speak in complete sentences due to shortness of breath. In the ED he was intubated and found to be covid-positive with elevated inflammatory markers, leukopenia, and streaky bilateral opacities on CXR consistent with pneumonia. Procalcitonin and lactic acid were normal. Admission to [facility] was requested. Oxygenation improved and he is extubated 5/2.    PT Comments    The patient is very motivated to ambulate and ambulated x 800' on  RA before saturation dropped to 87 % but very briefly. Placed on 2 L for 400' more with sats at 93%. Continue to ambiulate to determine need for supplemental oxygen. Continue PT  Follow Up Recommendations  No PT follow up     Equipment Recommendations       Recommendations for Other Services       Precautions / Restrictions Precautions Precaution Comments: monitor vitals, dropped sats on RA    Mobility  Bed Mobility Overal bed mobility: Independent                Transfers Overall transfer level: Independent                  Ambulation/Gait Ambulation/Gait assistance: Supervision Gait Distance (Feet): 1600 Feet     Gait velocity: slow speed   General Gait Details: supervision and assist for O2 tank and monitor   Stairs             Wheelchair Mobility    Modified Rankin (Stroke Patients Only)       Balance Overall balance assessment: Independent                                          Cognition Arousal/Alertness: Awake/alert                                            Exercises      General Comments         Pertinent Vitals/Pain Pain Assessment: No/denies pain    Home Living                      Prior Function            PT Goals (current goals can now be found in the care plan section) Progress towards PT goals: Progressing toward goals    Frequency    Min 5X/week      PT Plan Current plan remains appropriate;Frequency needs to be updated    Co-evaluation              AM-PAC PT "6 Clicks" Mobility   Outcome Measure  Help needed turning from your back to your side while in a flat bed without using bedrails?: None Help needed moving from lying on your back to sitting on the side of a flat bed without using bedrails?: None Help needed moving to and from a bed to a chair (including a wheelchair)?: None Help needed standing up from a  chair using your arms (e.g., wheelchair or bedside chair)?: None Help needed to walk in hospital room?: A Little Help needed climbing 3-5 steps with a railing? : A Little 6 Click Score: 22    End of Session Equipment Utilized During Treatment: Oxygen Activity Tolerance: Patient tolerated treatment well Patient left: (uop in room) Nurse Communication: Mobility status PT Visit Diagnosis: Difficulty in walking, not elsewhere classified (R26.2)     Time: [id number] PT Time Calculation (min) (ACUTE ONLY): 17 min  Charges:  $Gait Training: 8-22 mins                     Blanchard KelchKaren Etheridge Geil PT Acute Rehabilitation Services Pager [phone] Office [phone]    Rada HayHill, Shlome Baldree Elizabeth 01/03/2019, 4:00 PM

## 2019-01-03 NOTE — Progress Notes (Signed)
PROGRESS NOTE                                                                                                                                                                                                             Patient Demographics:    Brian Wilkinson, is a 53 y.o. male, DOB - 10-26-1965, YPP:[id number]  Outpatient Primary MD for the patient is [facility], [place]    LOS - 4  CC - SOB     Brief Narrative  Brian Wilkinson is a 53 y.o. male with no significant medical history who presented to [facility] with progressive dyspnea which had progressed constantly and was severe, only modestly better with NRB en route to ED. He was not able to speak in complete sentences due to shortness of breath. In the ED he was intubated and found to be covid-positive with elevated inflammatory markers, leukopenia, and streaky bilateral opacities on CXR consistent with pneumonia. Procalcitonin and lactic acid were normal. Admission to [facility] was requested. Oxygenation improved and he is extubated 5/2.   Subjective:   Patient in bed, appears comfortable, denies any headache, no fever, no chest pain or pressure, no shortness of breath , no abdominal pain. No focal weakness.   Assessment  & Plan :    Acute respiratory disease due to COVID-19 virus -   developed acute hypoxic respiratory failure due to COVID-19 infection requiring brief endotracheal intubation on 12/30/2018, he was extubated 12/31/2018, also received Actemra on 12/30/2018, clinically improving, now on 2Lits, encouraged to remain in chair, flutter valve I-S in place for pulmonary toiletry.  When in bed encouraged to prone.  Inflammatory markers trending towards stability.    COVID-19 Labs  Recent Labs    01/01/19 0350 01/02/19 0410 01/03/19 0420  DDIMER 0.70* 0.66* 0.50  FERRITIN 784* 793* 743*  LDH 221* 208* 193*  CRP 7.6* 4.2* 2.2*     Troponin elevation: Suspect  demand ischemia due to covid infection, Trop trending downward, ECG with sinus bradycardia without ischemic features. No chest pain.  Start him on aspirin along with statin, outpatient work up including echocardiogram will be required post discharge.      Condition - Extremely Guarded  Family Communication  :  None  Code Status :  Full  Diet : Regular  Disposition Plan  :  DC  in 1-2 days  Consults  :  None  Procedures  :    DVT Prophylaxis  :  Lovenox    Lab Results  Component Value Date   PLT 307 01/03/2019    Inpatient Medications  Scheduled Meds: . aspirin  325 mg Oral Daily  . atorvastatin  40 mg Oral q1800  . chlorhexidine gluconate (MEDLINE KIT)  15 mL Mouth Rinse BID  . enoxaparin (LOVENOX) injection  40 mg Subcutaneous Daily  . famotidine  20 mg Oral BID  . feeding supplement (ENSURE ENLIVE)  237 mL Oral BID BM  . methylPREDNISolone (SOLU-MEDROL) injection  80 mg Intravenous Q12H   Continuous Infusions: . sodium chloride Stopped (01/01/19 1120)   PRN Meds:.sodium chloride, menthol-cetylpyridinium, phenol  Antibiotics  :    Anti-infectives (From admission, onward)   None       Time Spent in minutes  30   Lala Lund M.D on 01/03/2019 at 9:43 AM  To page go to [email or web address] - password [email or web address]  [facility] -  Office  [phone]  See all Orders from today for further details   Admit date - 12/30/2018    4    Objective:   Vitals:   01/03/19 0422 01/03/19 0500 01/03/19 0700 01/03/19 0800  BP: 109/70     Pulse:   64 (!) 56  Resp:   (!) 23 (!) 21  Temp: 97.8 F (36.6 C)   98 F (36.7 C)  TempSrc:    Oral  SpO2:   96% 99%  Weight:  73.8 kg    Height:        Wt Readings from Last 3 Encounters:  01/03/19 73.8 kg  12/30/18 73.5 kg  12/22/18 73.5 kg     Intake/Output Summary (Last 24 hours) at 01/03/2019 0943 Last data filed at 01/03/2019 0400 Gross per 24 hour  Intake 660 ml  Output -  Net 660 ml     Physical Exam  Awake  Alert, Oriented X 3, No new F.N deficits, Normal affect [place].AT,PERRAL Supple Neck,No JVD, No cervical lymphadenopathy appriciated.  Symmetrical Chest wall movement, Good air movement bilaterally, CTAB RRR,No Gallops, Rubs or new Murmurs, No Parasternal Heave +ve B.Sounds, Abd Soft, No tenderness, No organomegaly appriciated, No rebound - guarding or rigidity. No Cyanosis, Clubbing or edema, No new Rash or bruise   Data Review:    CBC Recent Labs  Lab 12/30/18 0529 12/30/18 1415 12/31/18 0700 01/01/19 0350 01/02/19 0410 01/03/19 0420  WBC 3.7*  --  2.4* 2.8* 3.1* 7.1  HGB 12.6* 9.2* 11.7* 11.8* 12.1* 11.3*  HCT 37.5* 27.0* 35.0* 35.6* 35.7* 34.1*  PLT 152  --  172 241 299 307  MCV 94.7  --  97.2 96.5 95.7 96.6  MCH 31.8  --  32.5 32.0 32.4 32.0  MCHC 33.6  --  33.4 33.1 33.9 33.1  RDW 11.6  --  11.9 11.4* 11.4* 11.5  LYMPHSABS 0.7  --  0.5* 0.5* 0.4* 0.6*  MONOABS 0.2  --  0.2 0.2 0.1 0.2  EOSABS 0.0  --  0.0 0.1 0.0 0.0  BASOSABS 0.0  --  0.0 0.0 0.0 0.0    Chemistries  Recent Labs  Lab 12/30/18 0529 12/30/18 1415 12/30/18 1600 12/31/18 0700 01/01/19 0350 01/02/19 0410 01/03/19 0420  NA 134* 137  --  137 138 138 139  K 3.8 4.2  --  3.5 3.5 4.2 4.4  CL 97*  --   --  100 100 104 103  CO2  25  --   --  '27 27 26 28  ' GLUCOSE 133*  --   --  149* 98 134* 147*  BUN 14  --   --  '18 13 17 ' 23*  CREATININE 0.74  --   --  0.61 0.61 0.68 0.70  CALCIUM 8.0*  --   --  8.1* 8.4* 8.7* 8.6*  MG  --   --  2.4 2.0 2.1 2.2 2.2  AST 31  --   --  37 31 89* 154*  ALT 19  --   --  23 24 80* 209*  ALKPHOS 59  --   --  52 52 54 54  BILITOT 0.9  --   --  0.5 0.3 0.5 0.3   ------------------------------------------------------------------------------------------------------------------ Recent Labs    01/02/19 0410 01/03/19 0420  TRIG 127 86    Lab Results  Component Value Date   HGBA1C 5.6 12/31/2018    ------------------------------------------------------------------------------------------------------------------ No results for input(s): TSH, T4TOTAL, T3FREE, THYROIDAB in the last 72 hours.  Invalid input(s): FREET3  Cardiac Enzymes Recent Labs  Lab 12/30/18 1600 12/30/18 2305 12/31/18 0700  TROPONINI 0.37* 0.21* 0.11*   ------------------------------------------------------------------------------------------------------------------ No results found for: BNP  Micro Results Recent Results (from the past 240 hour(s))  Blood Culture (routine x 2)     Status: None (Preliminary result)   Collection Time: 12/30/18  5:28 AM  Result Value Ref Range Status   Specimen Description BLOOD LEFT ANTECUBITAL  Final   Special Requests   Final    BOTTLES DRAWN AEROBIC AND ANAEROBIC Blood Culture adequate volume   Culture   Final    NO GROWTH 4 DAYS Performed at [facility], [address], [place], [place] [address]    Report Status PENDING  Incomplete  Blood Culture (routine x 2)     Status: None (Preliminary result)   Collection Time: 12/30/18  5:28 AM  Result Value Ref Range Status   Specimen Description BLOOD BLOOD LEFT FOREARM  Final   Special Requests   Final    BOTTLES DRAWN AEROBIC AND ANAEROBIC Blood Culture adequate volume   Culture   Final    NO GROWTH 4 DAYS Performed at [facility], [address], [place], [place] [address]    Report Status PENDING  Incomplete  MRSA PCR Screening     Status: None   Collection Time: 12/30/18  1:15 PM  Result Value Ref Range Status   MRSA by PCR NEGATIVE NEGATIVE Final    Comment:        The GeneXpert MRSA Assay (FDA approved for NASAL specimens only), is one component of a comprehensive MRSA colonization surveillance program. It is not intended to diagnose MRSA infection nor to guide or monitor treatment for MRSA infections. Performed at [facility], [place] [address] [place], [place]  [address]     Radiology Reports Dg Chest Port 1 View  Result Date: 12/31/2018 CLINICAL DATA:  Acute respiratory failure. EXAM: PORTABLE CHEST 1 VIEW COMPARISON:  Chest x-ray from yesterday. FINDINGS: Endotracheal tube tip 2.0 cm above the carina. Unchanged enteric tube. Stable cardiomediastinal silhouette. Normal pulmonary vascularity. Persistent low lung volumes with relatively unchanged patchy asymmetric basilar predominant opacities. No pleural effusion or pneumothorax. No acute osseous abnormality. IMPRESSION: 1. Unchanged bibasilar pneumonia. Electronically Signed   By: Titus Dubin M.D.   On: 12/31/2018 05:49   Dg Chest Port 1 View  Result Date: 12/30/2018 CLINICAL DATA:  Respiratory distress EXAM: PORTABLE CHEST 1 VIEW COMPARISON:  12/22/2018 FINDINGS: Endotracheal tube with tip 1.5 cm above the carina. The orogastric tube tip is in the gastric fundus. Low volume chest with streaky and hazy opacity on both sides. No Kerley lines, effusion, or pneumothorax. Normal heart size. IMPRESSION: 1. Endotracheal tube with tip 15 mm above the carina. 2. The orogastric tube tip is in the stomach. 3. Positive COVID test with bilateral pneumonia. Electronically Signed   By: Monte Fantasia M.D.   On: 12/30/2018 06:06   Dg Chest Port 1 View  Result Date: 12/22/2018 CLINICAL DATA:  Chills, cough EXAM: PORTABLE CHEST 1 VIEW COMPARISON:  None FINDINGS: Linear atelectasis or scarring at the left base. No confluent opacity on the right. Heart is upper limits normal in size. No effusions or acute bony abnormality. IMPRESSION: Left base scarring or atelectasis. Electronically Signed   By: Rolm Baptise M.D.   On: 12/22/2018 22:35

## 2019-01-03 NOTE — Progress Notes (Signed)
Occupational Therapy Treatment Patient Details Name: Brian Wilkinson MRN: [id number] DOB: 1966/03/08 Today's Date: 01/03/2019    History of present illness 53 y.o. male with no significant medical history who presented to [facility] with progressive dyspnea which had progressed constantly and was severe, only modestly better with NRB en route to ED. He was not able to speak in complete sentences due to shortness of breath. In the ED he was intubated and found to be covid-positive with elevated inflammatory markers, leukopenia, and streaky bilateral opacities on CXR consistent with pneumonia. Procalcitonin and lactic acid were normal. Admission to [facility] was requested. Oxygenation improved and he is extubated 5/2.   OT comments  Pt able to complete ADL independently. Completed education regarding energy conservation. Pt able to ambulate @ at least 500 ft with SpO2 93 on RA. No SOB. Pt left on RA at end of session with SpO2 96. Pt states he feels much better. Goals met. OT signing off.   Follow Up Recommendations  No OT follow up;Supervision - Intermittent    Equipment Recommendations  None recommended by OT    Recommendations for Other Services      Precautions / Restrictions Precautions Precautions: None Precaution Comments: monitor vitals, dropped sats on RA       Mobility Bed Mobility Overal bed mobility: Independent                Transfers Overall transfer level: Independent                    Balance Overall balance assessment: Independent                                         ADL either performed or assessed with clinical judgement   ADL                                       Functional mobility during ADLs: Independent General ADL Comments: Completed education regarding energy conservation and activity modification. Encouraged pt to use a shower seat if available - pt verbalized understanding.      Vision       Perception      Praxis      Cognition Arousal/Alertness: Awake/alert Behavior During Therapy: WFL for tasks assessed/performed Overall Cognitive Status: Within Functional Limits for tasks assessed                                          Exercises Exercises: Other exercises Other Exercises Other Exercises: incentive spirometer x 10    Shoulder Instructions       General Comments      Pertinent Vitals/ Pain       Pain Assessment: No/denies pain  Home Living                                          Prior Functioning/Environment              Frequency           Progress Toward Goals  OT Goals(current goals can now be found in the care plan  section)  Progress towards OT goals: Goals met/education completed, patient discharged from OT  Acute Rehab OT Goals Patient Stated Goal: to go home OT Goal Formulation: With patient Time For Goal Achievement: 01/15/19 Potential to Achieve Goals: Good ADL Goals Pt Will Perform Lower Body Bathing: with modified independence;sit to/from stand Pt Will Perform Lower Body Dressing: with modified independence;sit to/from stand Pt Will Transfer to Toilet: with modified independence;ambulating Pt Will Perform Toileting - Clothing Manipulation and hygiene: with modified independence;sit to/from stand Pt Will Perform Tub/Shower Transfer: Independently;Tub transfer;ambulating Pt/caregiver will Perform Home Exercise Program: Increased strength;Both right and left upper extremity;With written HEP provided;Independently Additional ADL Goal #1: Pt will independently verbalize 3 energy conservation strategies for ADL and IADL tasks  Plan All goals met and education completed, patient discharged from OT services    Co-evaluation                 AM-PAC OT "6 Clicks" Daily Activity     Outcome Measure   Help from another person eating meals?: None Help from another person taking care of personal grooming?:  None Help from another person toileting, which includes using toliet, bedpan, or urinal?: None Help from another person bathing (including washing, rinsing, drying)?: None Help from another person to put on and taking off regular upper body clothing?: None Help from another person to put on and taking off regular lower body clothing?: None 6 Click Score: 24    End of Session    OT Visit Diagnosis: Unsteadiness on feet (R26.81);Muscle weakness (generalized) (M62.81)   Activity Tolerance Patient tolerated treatment well   Patient Left in bed;with call bell/phone within reach   Nurse Communication Mobility status;Other (comment)        Time: 1505-6979 OT Time Calculation (min): 25 min  Charges: OT General Charges $OT Visit: 1 Visit OT Treatments $Self Care/Home Management : 23-37 mins  Maurie Boettcher, OT/L   Acute OT Clinical Specialist [place] Pager [phone] Office [phone]    [facility] 01/03/2019, 4:48 PM

## 2019-01-04 DIAGNOSIS — J069 Acute upper respiratory infection, unspecified: Secondary | ICD-10-CM | POA: Diagnosis not present

## 2019-01-04 DIAGNOSIS — U071 COVID-19: Secondary | ICD-10-CM | POA: Diagnosis not present

## 2019-01-04 LAB — CBC WITH DIFFERENTIAL/PLATELET
Abs Immature Granulocytes: 0.09 10*3/uL — ABNORMAL HIGH (ref 0.00–0.07)
Basophils Absolute: 0 10*3/uL (ref 0.0–0.1)
Basophils Relative: 0 %
Eosinophils Absolute: 0 10*3/uL (ref 0.0–0.5)
Eosinophils Relative: 0 %
HCT: 37.6 % — ABNORMAL LOW (ref 39.0–52.0)
Hemoglobin: 12.6 g/dL — ABNORMAL LOW (ref 13.0–17.0)
Immature Granulocytes: 1 %
Lymphocytes Relative: 10 %
Lymphs Abs: 0.8 10*3/uL (ref 0.7–4.0)
MCH: 32.1 pg (ref 26.0–34.0)
MCHC: 33.5 g/dL (ref 30.0–36.0)
MCV: 95.9 fL (ref 80.0–100.0)
Monocytes Absolute: 0.2 10*3/uL (ref 0.1–1.0)
Monocytes Relative: 3 %
Neutro Abs: 6.9 10*3/uL (ref 1.7–7.7)
Neutrophils Relative %: 86 %
Platelets: 390 10*3/uL (ref 150–400)
RBC: 3.92 MIL/uL — ABNORMAL LOW (ref 4.22–5.81)
RDW: 11.5 % (ref 11.5–15.5)
WBC: 8 10*3/uL (ref 4.0–10.5)
nRBC: 0 % (ref 0.0–0.2)

## 2019-01-04 LAB — CULTURE, BLOOD (ROUTINE X 2)
Culture: NO GROWTH
Culture: NO GROWTH
Special Requests: ADEQUATE
Special Requests: ADEQUATE

## 2019-01-04 LAB — LACTATE DEHYDROGENASE: LDH: 169 U/L (ref 98–192)

## 2019-01-04 LAB — COMPREHENSIVE METABOLIC PANEL
ALT: 173 U/L — ABNORMAL HIGH (ref 0–44)
AST: 54 U/L — ABNORMAL HIGH (ref 15–41)
Albumin: 3.5 g/dL (ref 3.5–5.0)
Alkaline Phosphatase: 60 U/L (ref 38–126)
Anion gap: 10 (ref 5–15)
BUN: 20 mg/dL (ref 6–20)
CO2: 26 mmol/L (ref 22–32)
Calcium: 8.7 mg/dL — ABNORMAL LOW (ref 8.9–10.3)
Chloride: 102 mmol/L (ref 98–111)
Creatinine, Ser: 0.69 mg/dL (ref 0.61–1.24)
GFR calc Af Amer: >60 mL/min (ref >60)
GFR calc non Af Amer: >60 mL/min (ref >60)
Glucose, Bld: 135 mg/dL — ABNORMAL HIGH (ref 70–99)
Potassium: 3.9 mmol/L (ref 3.5–5.1)
Sodium: 138 mmol/L (ref 135–145)
Total Bilirubin: 0.5 mg/dL (ref 0.3–1.2)
Total Protein: 7.4 g/dL (ref 6.5–8.1)

## 2019-01-04 LAB — D-DIMER, QUANTITATIVE (NOT AT ARMC): D-Dimer, Quant: 0.49 ug/mL-FEU (ref 0.00–0.50)

## 2019-01-04 LAB — TRIGLYCERIDES: Triglycerides: 129 mg/dL (ref <150)

## 2019-01-04 LAB — MAGNESIUM: Magnesium: 2.3 mg/dL (ref 1.7–2.4)

## 2019-01-04 LAB — FERRITIN: Ferritin: 617 ng/mL — ABNORMAL HIGH (ref 24–336)

## 2019-01-04 LAB — PHOSPHORUS: Phosphorus: 4.3 mg/dL (ref 2.5–4.6)

## 2019-01-04 LAB — C-REACTIVE PROTEIN: CRP: 1.5 mg/dL — ABNORMAL HIGH (ref <1.0)

## 2019-01-04 MED ORDER — ASPIRIN 81 MG PO CHEW
324.0000 mg | CHEWABLE_TABLET | Freq: Every day | ORAL | Status: DC
  Administered 2019-01-04: 324 mg via ORAL
  Filled 2019-01-04: qty 4

## 2019-01-04 MED ORDER — ASPIRIN EC 81 MG PO TBEC: 81.0000 mg | DELAYED_RELEASE_TABLET | Freq: Every day | ORAL | 0 refills | Status: AC

## 2019-01-04 MED ORDER — PREDNISONE 5 MG PO TABS: ORAL_TABLET | ORAL | 0 refills | Status: AC

## 2019-01-04 MED ORDER — ATORVASTATIN CALCIUM 40 MG PO TABS: 40.0000 mg | ORAL_TABLET | Freq: Every day | ORAL | 0 refills | Status: AC

## 2019-01-04 NOTE — Discharge Instructions (Signed)
Follow with Primary MD [facility], [facility] in 7 days   Get CBC, CMP, 2 view Chest X ray -  checked  by Primary MD in 5-7 days    Activity: As tolerated with Full fall precautions use walker/cane & assistance as needed  Disposition Home    Diet: Heart Healthy   Special Instructions: If you have smoked or chewed Tobacco  in the last 2 yrs please stop smoking, stop any regular Alcohol  and or any Recreational drug use.  On your next visit with your primary care physician please Get Medicines reviewed and adjusted.  Please request your Prim.MD to go over all Hospital Tests and Procedure/Radiological results at the follow up, please get all Hospital records sent to your Prim MD by signing hospital release before you go home.  If you experience worsening of your admission symptoms, develop shortness of breath, life threatening emergency, suicidal or homicidal thoughts you must seek medical attention immediately by calling 911 or calling your MD immediately  if symptoms less severe.  You Must read complete instructions/literature along with all the possible adverse reactions/side effects for all the Medicines you take and that have been prescribed to you. Take any new Medicines after you have completely understood and accpet all the possible adverse reactions/side effects.         Person Under Monitoring Name: Brian Wilkinson  Location: [address] [place] [address]   Infection Prevention Recommendations for Individuals Confirmed to have, or Being Evaluated for, 2019 Novel Coronavirus (COVID-19) Infection Who Receive Care at Home  Individuals who are confirmed to have, or are being evaluated for, COVID-19 should follow the prevention steps below until a healthcare provider or local or state health department says they can return to normal activities.  Stay home except to get medical care You should restrict activities outside your home, except for getting  medical care. Do not go to work, school, or public areas, and do not use public transportation or taxis.  Call ahead before visiting your doctor Before your medical appointment, call the healthcare provider and tell them that you have, or are being evaluated for, COVID-19 infection. This will help the healthcare providers office take steps to keep other people from getting infected. Ask your healthcare provider to call the local or state health department.  Monitor your symptoms Seek prompt medical attention if your illness is worsening (e.g., difficulty breathing). Before going to your medical appointment, call the healthcare provider and tell them that you have, or are being evaluated for, COVID-19 infection. Ask your healthcare provider to call the local or state health department.  Wear a facemask You should wear a facemask that covers your nose and mouth when you are in the same room with other people and when you visit a healthcare provider. People who live with or visit you should also wear a facemask while they are in the same room with you.  Separate yourself from other people in your home As much as possible, you should stay in a different room from other people in your home. Also, you should use a separate bathroom, if available.  Avoid sharing household items You should not share dishes, drinking glasses, cups, eating utensils, towels, bedding, or other items with other people in your home. After using these items, you should wash them thoroughly with soap and water.  Cover your coughs and sneezes Cover your mouth and nose with a tissue when you cough or sneeze, or you can  cough or sneeze into your sleeve. Throw used tissues in a lined trash can, and immediately wash your hands with soap and water for at least 20 seconds or use an alcohol-based hand rub.  Wash your [facility] your hands often and thoroughly with soap and water for at least 20 seconds. You can use an  alcohol-based hand sanitizer if soap and water are not available and if your hands are not visibly dirty. Avoid touching your eyes, nose, and mouth with unwashed hands.   Prevention Steps for Caregivers and Household Members of Individuals Confirmed to have, or Being Evaluated for, COVID-19 Infection Being Cared for in the Home  If you live with, or provide care at home for, a person confirmed to have, or being evaluated for, COVID-19 infection please follow these guidelines to prevent infection:  Follow healthcare providers instructions Make sure that you understand and can help the patient follow any healthcare provider instructions for all care.  Provide for the patients basic needs You should help the patient with basic needs in the home and provide support for getting groceries, prescriptions, and other personal needs.  Monitor the patients symptoms If they are getting sicker, call his or her medical provider and tell them that the patient has, or is being evaluated for, COVID-19 infection. This will help the healthcare providers office take steps to keep other people from getting infected. Ask the healthcare provider to call the local or state health department.  Limit the number of people who have contact with the patient  If possible, have only one caregiver for the patient.  Other household members should stay in another home or place of residence. If this is not possible, they should stay  in another room, or be separated from the patient as much as possible. Use a separate bathroom, if available.  Restrict visitors who do not have an essential need to be in the home.  Keep older adults, very young children, and other sick people away from the patient Keep older adults, very young children, and those who have compromised immune systems or chronic health conditions away from the patient. This includes people with chronic heart, lung, or kidney conditions, diabetes, and  cancer.  Ensure good ventilation Make sure that shared spaces in the home have good air flow, such as from an air conditioner or an opened window, weather permitting.  Wash your hands often  Wash your hands often and thoroughly with soap and water for at least 20 seconds. You can use an alcohol based hand sanitizer if soap and water are not available and if your hands are not visibly dirty.  Avoid touching your eyes, nose, and mouth with unwashed hands.  Use disposable paper towels to dry your hands. If not available, use dedicated cloth towels and replace them when they become wet.  Wear a facemask and gloves  Wear a disposable facemask at all times in the room and gloves when you touch or have contact with the patients blood, body fluids, and/or secretions or excretions, such as sweat, saliva, sputum, nasal mucus, vomit, urine, or feces.  Ensure the mask fits over your nose and mouth tightly, and do not touch it during use.  Throw out disposable facemasks and gloves after using them. Do not reuse.  Wash your hands immediately after removing your facemask and gloves.  If your personal clothing becomes contaminated, carefully remove clothing and launder. Wash your hands after handling contaminated clothing.  Place all used disposable facemasks, gloves, and  other waste in a lined container before disposing them with other household waste.  Remove gloves and wash your hands immediately after handling these items.  Do not share dishes, glasses, or other household items with the patient  Avoid sharing household items. You should not share dishes, drinking glasses, cups, eating utensils, towels, bedding, or other items with a patient who is confirmed to have, or being evaluated for, COVID-19 infection.  After the person uses these items, you should wash them thoroughly with soap and water.  Wash laundry thoroughly  Immediately remove and wash clothes or bedding that have blood, body  fluids, and/or secretions or excretions, such as sweat, saliva, sputum, nasal mucus, vomit, urine, or feces, on them.  Wear gloves when handling laundry from the patient.  Read and follow directions on labels of laundry or clothing items and detergent. In general, wash and dry with the warmest temperatures recommended on the label.  Clean all areas the individual has used often  Clean all touchable surfaces, such as counters, tabletops, doorknobs, bathroom fixtures, toilets, phones, keyboards, tablets, and bedside tables, every day. Also, clean any surfaces that may have blood, body fluids, and/or secretions or excretions on them.  Wear gloves when cleaning surfaces the patient has come in contact with.  Use a diluted bleach solution (e.g., dilute bleach with 1 part bleach and 10 parts water) or a household disinfectant with a label that says EPA-registered for coronaviruses. To make a bleach solution at home, add 1 tablespoon of bleach to 1 quart (4 cups) of water. For a larger supply, add  cup of bleach to 1 gallon (16 cups) of water.  Read labels of cleaning products and follow recommendations provided on product labels. Labels contain instructions for safe and effective use of the cleaning product including precautions you should take when applying the product, such as wearing gloves or eye protection and making sure you have good ventilation during use of the product.  Remove gloves and wash hands immediately after cleaning.  Monitor yourself for signs and symptoms of illness Caregivers and household members are considered close contacts, should monitor their health, and will be asked to limit movement outside of the home to the extent possible. Follow the monitoring steps for close contacts listed on the symptom monitoring form.   ? If you have additional questions, contact your local health department or call the epidemiologist on call at [phone] (available 24/7). ? This  guidance is subject to change. For the most up-to-date guidance from [facility], please refer to their website: [email or web address]

## 2019-01-04 NOTE — Progress Notes (Signed)
Physical Therapy Treatment Patient Details Name: Brian Wilkinson MRN: [id number] DOB: [birth year] Today's Date: 01/04/2019    History of Present Illness 53 y.o. male with no significant medical history who [facility] with progressive dyspnea which had progressed constantly and was severe, only modestly better with NRB en route to ED. He was not able to speak in complete sentences due to shortness of breath. In the ED he was intubated and found to be covid-positive with elevated inflammatory markers, leukopenia, and streaky bilateral opacities on CXR consistent with pneumonia. Procalcitonin and lactic acid were normal. Admission to [facility] was requested. Oxygenation improved and he is extubated 5/2.    PT Comments    Patient is ambulating on RA with saturation > 89%. Plans DC today.   Follow Up Recommendations  No PT follow up     Equipment Recommendations  None recommended by PT    Recommendations for Other Services       Precautions / Restrictions Precautions Precautions: None    Mobility  Bed Mobility Overal bed mobility: Independent                Transfers Overall transfer level: Independent     Sit to Stand: Independent            Ambulation/Gait Ambulation/Gait assistance: Independent Gait Distance (Feet): 1600 Feet Assistive device: None       General Gait Details: Independent- saturation lowest 89%.   Stairs             Wheelchair Mobility    Modified Rankin (Stroke Patients Only)       Balance                                            Cognition Arousal/Alertness: Awake/alert                                            Exercises      General Comments        Pertinent Vitals/Pain Pain Assessment: No/denies pain    Home Living                      Prior Function            PT Goals (current goals can now be found in the care plan section) Progress towards PT goals:  Progressing toward goals    Frequency    Min 5X/week      PT Plan Current plan remains appropriate    Co-evaluation              AM-PAC PT "6 Clicks" Mobility   Outcome Measure  Help needed turning from your back to your side while in a flat bed without using bedrails?: None Help needed moving from lying on your back to sitting on the side of a flat bed without using bedrails?: None Help needed moving to and from a bed to a chair (including a wheelchair)?: None Help needed standing up from a chair using your arms (e.g., wheelchair or bedside chair)?: None Help needed to walk in hospital room?: None Help needed climbing 3-5 steps with a railing? : None 6 Click Score: 24    End of Session   Activity Tolerance: Patient tolerated  treatment well Patient left: in chair Nurse Communication: Mobility status PT Visit Diagnosis: Difficulty in walking, not elsewhere classified (R26.2)     Time: 1100-1116 PT Time Calculation (min) (ACUTE ONLY): 16 min  Charges:  $Gait Training: 8-22 mins                     Blanchard Kelch PT Acute Rehabilitation Services Pager [phone] Office [phone]    Rada Hay 01/04/2019, 2:08 PM

## 2019-01-04 NOTE — TOC Transition Note (Signed)
Transition of Care [facility]) - CM/SW Discharge Note   Patient Details  Name: Brian Wilkinson MRN: [id number] Date of Birth: 10/27/1965  [facility]) CM/SW Contact:  Colleen Can RN, BSN, NCM-BC, ACM-RN [phone] Phone Number: 01/04/2019, 12:04 PM   Clinical Narrative:    53 yo male presented with progressive dyspnea and found to be COVID-19 (+). Patient lived at home and was independent with his ADLs PTA with no DME in use. PCP: Dr. Hessie Diener ([facility]); Hospital f/u televisit appointment arranged on: 01/05/19 @ 0940 with Dr.Bender; AVS updated. Patient is tolerating RA with no DME needs or request for Rx assistance. No further needs from CM.    Final next level of care: Home/Self Care Barriers to Discharge: No Barriers Identified   Patient Goals and CMS Choice Patient states their goals for this hospitalization and ongoing recovery are:: "to go home" CMS Medicare.gov Compare Post Acute Care list provided to:: (N/A) Choice offered to / list presented to : NA  Discharge Plan and Services                DME Arranged: N/A DME Agency: NA       HH Arranged: NA HH Agency: NA

## 2019-01-04 NOTE — Progress Notes (Addendum)
Pt belongings (2 shirts, boxers and socks) and AVS were left in room when DC. This Clinical research associate notified Ester Spouse at 1850 to inform her of how to pick up from hospital. Domingo Cocking stated to trash belongings. Reviewed AVS with spouse and she wrote down names and phone numbers of who to schedule an apt with.

## 2019-01-04 NOTE — Progress Notes (Signed)
                                      [facility]                            [address]                            [place], [place] [address]      Brian Wilkinson was admitted to the Hospital on 12/30/2018 and Discharged  01/04/2019 and should be excused from work/school   for 21  days starting from date -  12/30/2018 , may return to work/ once cleared by his PCP.  Call Susa Raring MD, [facility]  [phone] with questions.  Susa Raring M.D on 01/04/2019,at 9:33 AM  [facility]   Office  [phone]

## 2019-01-04 NOTE — Discharge Summary (Signed)
SEDERICK JACOBSEN ZOX:[id number] DOB: Nov 24, 1965 DOA: 12/30/2018  PCP: [facility]  Admit date: 12/30/2018  Discharge date: 01/04/2019  Admitted From: Home   Disposition:  Home   Recommendations for Outpatient Follow-up:   Follow up with PCP in 1-2 weeks  PCP Please obtain BMP/CBC, 2 view CXR in 1week,  (see Discharge instructions)   PCP Please follow up on the following pending results:    Home Health: None   Equipment/Devices: None  Consultations: None Discharge Condition: Stable   CODE STATUS: Full   Diet Recommendation: Heart Healthy    CC - Fever   Brief history of present illness from the day of admission and additional interim summary    Ladislav M Teodorois a52 y.o.malewith no significant medical history who presented to [facility] withprogressive dyspnea which had progressed constantly and was severe, only modestly better with NRB en route to ED. He was not able to speak in complete sentences due to shortness of breath. In the ED he was intubated and found to be covid-positive with elevated inflammatory markers, leukopenia, and streaky bilateral opacities on CXR consistent with pneumonia. Procalcitonin and lactic acid were normal. Admission to [facility] was requested.Oxygenation improved and he is extubated 5/2.                                                                 Hospital Course   Acute respiratory disease due to COVID-19 virus -   developed acute hypoxic respiratory failure due to COVID-19 infection requiring brief endotracheal intubation on 12/30/2018, he was extubated 12/31/2018, he received Actemra on 12/30/2018 and was kept on IV steroids, clinically improved, now on RA x 1 day,  Inflammatory markers trending towards stability, he is now symptom free will be discharged home on RA with  PO steroid taper. Isolation instructions and work note provided.  COVID-19 Labs  Recent Labs    01/02/19 0410 01/03/19 0420 01/04/19 0600  DDIMER 0.66* 0.50 0.49  FERRITIN 793* 743* 617*  LDH 208* 193* 169  CRP 4.2* 2.2* 1.5*    Lab Results  Component Value Date   SARSCOV2NAA POSITIVE (A) 12/22/2018     Troponin elevation: Suspect demand ischemia due to covid infection, Trop trending downward, ECG with sinus bradycardia without ischemic features. No chest pain.  Started him on aspirin along with statin, outpatient work up including echocardiogram will be required post discharge. If in the future blood pressure stabilizes PCP can place him on low-dose Coreg.   Discharge diagnosis     Principal Problem:   Acute respiratory disease due to COVID-19 virus Active Problems:   Acute respiratory failure with hypoxia [facility])    Discharge instructions    Discharge Instructions    Diet - low sodium heart healthy   Complete by:  As directed  Discharge instructions   Complete by:  As directed    Follow with Primary MD [facility] in 7 days   Get CBC, CMP, 2 view Chest X ray -  checked  by Primary MD in 5-7 days    Activity: As tolerated with Full fall precautions use walker/cane & assistance as needed  Disposition Home    Diet: Heart Healthy   Special Instructions: If you have smoked or chewed Tobacco  in the last 2 yrs please stop smoking, stop any regular Alcohol  and or any Recreational drug use.  On your next visit with your primary care physician please Get Medicines reviewed and adjusted.  Please request your Prim.MD to go over all Hospital Tests and Procedure/Radiological results at the follow up, please get all Hospital records sent to your Prim MD by signing hospital release before you go home.  If you experience worsening of your admission symptoms, develop shortness of breath, life threatening emergency, suicidal or homicidal thoughts  you must seek medical attention immediately by calling 911 or calling your MD immediately  if symptoms less severe.  You Must read complete instructions/literature along with all the possible adverse reactions/side effects for all the Medicines you take and that have been prescribed to you. Take any new Medicines after you have completely understood and accpet all the possible adverse reactions/side effects.   Increase activity slowly   Complete by:  As directed    MyChart COVID-19 home monitoring program   Complete by:  Jan 04, 2019    Is the patient willing to use the MyChart Mobile App for home monitoring?:  Yes   Temperature monitoring   Complete by:  Jan 04, 2019    After how many days would you like to receive a notification of this patient's flowsheet entries?:  1      Discharge Medications   Allergies as of 01/04/2019   No Known Allergies     Medication List    TAKE these medications   aspirin EC 81 MG tablet Take 1 tablet (81 mg total) by mouth daily.   atorvastatin 40 MG tablet Commonly known as:  LIPITOR Take 1 tablet (40 mg total) by mouth daily at 6 PM.   predniSONE 5 MG tablet Commonly known as:  DELTASONE Label  & dispense according to the schedule below. 10 Pills PO for 3 days then, 8 Pills PO for 3 days, 6 Pills PO for 3 days, 4 Pills PO for 3 days, 2 Pills PO for 3 days, 1 Pills PO for 3 days, 1/2 Pill  PO for 3 days then STOP. Total 95 pills.            Durable Medical Equipment  (From admission, onward)         Start     Ordered   01/03/19 0706  For home use only DME oxygen  Once    Question Answer Comment  Mode or (Route) Nasal cannula   Liters per Minute 2   Oxygen conserving device Yes   Oxygen delivery system Gas      01/03/19 0705          Follow-up Information    [facility]. Schedule an appointment as soon as possible for a visit in 1 week(s).   Specialty:  General Practice Contact information: [address] [place] [address] [phone]        Jake Bathe, MD. Schedule an appointment as  soon as possible for a visit in 2 week(s).   Specialty:  Cardiology Why:  Echo, Mild troponin leak Contact information: [address] [place] [address] [phone]           Major procedures and Radiology Reports - PLEASE review detailed and final reports thoroughly  -         Dg Chest Port 1 View  Result Date: 12/31/2018 CLINICAL DATA:  Acute respiratory failure. EXAM: PORTABLE CHEST 1 VIEW COMPARISON:  Chest x-ray from yesterday. FINDINGS: Endotracheal tube tip 2.0 cm above the carina. Unchanged enteric tube. Stable cardiomediastinal silhouette. Normal pulmonary vascularity. Persistent low lung volumes with relatively unchanged patchy asymmetric basilar predominant opacities. No pleural effusion or pneumothorax. No acute osseous abnormality. IMPRESSION: 1. Unchanged bibasilar pneumonia. Electronically Signed   By: Obie DredgeWilliam T Derry M.D.   On: 12/31/2018 05:49   Dg Chest Port 1 View  Result Date: 12/30/2018 CLINICAL DATA:  Respiratory distress EXAM: PORTABLE CHEST 1 VIEW COMPARISON:  12/22/2018 FINDINGS: Endotracheal tube with tip 1.5 cm above the carina. The orogastric tube tip is in the gastric fundus. Low volume chest with streaky and hazy opacity on both sides. No Kerley lines, effusion, or pneumothorax. Normal heart size. IMPRESSION: 1. Endotracheal tube with tip 15 mm above the carina. 2. The orogastric tube tip is in the stomach. 3. Positive COVID test with bilateral pneumonia. Electronically Signed   By: Marnee SpringJonathon  Watts M.D.   On: 12/30/2018 06:06   Dg Chest Port 1 View  Result Date: 12/22/2018 CLINICAL DATA:  Chills, cough EXAM: PORTABLE CHEST 1 VIEW COMPARISON:  None FINDINGS: Linear atelectasis or scarring at the left base. No confluent opacity on the right. Heart is upper limits normal in size. No effusions or acute bony abnormality. IMPRESSION:  Left base scarring or atelectasis. Electronically Signed   By: Charlett NoseKevin  Dover M.D.   On: 12/22/2018 22:35    Micro Results     Recent Results (from the past 240 hour(s))  Blood Culture (routine x 2)     Status: None   Collection Time: 12/30/18  5:28 AM  Result Value Ref Range Status   Specimen Description BLOOD LEFT ANTECUBITAL  Final   Special Requests   Final    BOTTLES DRAWN AEROBIC AND ANAEROBIC Blood Culture adequate volume   Culture   Final    NO GROWTH 5 DAYS Performed at [facility], [address] [place], [place] [address]    Report Status 01/04/2019 FINAL  Final  Blood Culture (routine x 2)     Status: None   Collection Time: 12/30/18  5:28 AM  Result Value Ref Range Status   Specimen Description BLOOD BLOOD LEFT FOREARM  Final   Special Requests   Final    BOTTLES DRAWN AEROBIC AND ANAEROBIC Blood Culture adequate volume   Culture   Final    NO GROWTH 5 DAYS Performed at [facility], [address] [place], [place] [address]    Report Status 01/04/2019 FINAL  Final  MRSA PCR Screening     Status: None   Collection Time: 12/30/18  1:15 PM  Result Value Ref Range Status   MRSA by PCR NEGATIVE NEGATIVE Final    Comment:        The GeneXpert MRSA Assay (FDA approved for NASAL specimens only), is one component of a comprehensive MRSA colonization surveillance program. It is not intended to diagnose MRSA infection nor to guide or monitor treatment for MRSA infections. Performed  at [facility], [address] [place], [place] [address]     Today   Subjective    Ishaaq Mudgett today has no headache,no chest abdominal pain,no new weakness tingling or numbness, feels much better wants to go home today.     Objective   Blood pressure 102/65, pulse 79, temperature 98.2 F (36.8 C), temperature source Oral, resp. rate (!) 25, height 5\' 4"  (1.626 m), weight 76 kg, SpO2 91 % RA   Intake/Output Summary (Last 24 hours) at  01/04/2019 0936 Last data filed at 01/04/2019 6606 Gross per 24 hour  Intake 840 ml  Output --  Net 840 ml    Exam  Awake Alert, Oriented x 3, No new F.N deficits, Normal affect [place].AT,PERRAL Supple Neck,No JVD, No cervical lymphadenopathy appriciated.  Symmetrical Chest wall movement, Good air movement bilaterally, CTAB RRR,No Gallops,Rubs or new Murmurs, No Parasternal Heave +ve B.Sounds, Abd Soft, Non tender, No organomegaly appriciated, No rebound -guarding or rigidity. No Cyanosis, Clubbing or edema, No new Rash or bruise   Data Review   CBC w Diff:  Lab Results  Component Value Date   WBC 8.0 01/04/2019   HGB 12.6 (L) 01/04/2019   HCT 37.6 (L) 01/04/2019   PLT 390 01/04/2019   LYMPHOPCT 10 01/04/2019   MONOPCT 3 01/04/2019   EOSPCT 0 01/04/2019   BASOPCT 0 01/04/2019    CMP:  Lab Results  Component Value Date   NA 138 01/04/2019   K 3.9 01/04/2019   CL 102 01/04/2019   CO2 26 01/04/2019   BUN 20 01/04/2019   CREATININE 0.69 01/04/2019   PROT 7.4 01/04/2019   ALBUMIN 3.5 01/04/2019   BILITOT 0.5 01/04/2019   ALKPHOS 60 01/04/2019   AST 54 (H) 01/04/2019   ALT 173 (H) 01/04/2019  .   Total Time in preparing paper work, data evaluation and todays exam - 35 minutes  Susa Raring M.D on 01/04/2019 at 9:36 AM  [facility]   Office  [phone]

## 2019-01-11 NOTE — Telephone Encounter (Signed)
Called pt. He said he doesn't remember his password. We setup a new temporary password. Pt will try to log in to complete. He has to get someone to help him.

## 2019-01-26 ENCOUNTER — Telehealth: Payer: Self-pay | Admitting: *Deleted

## 2019-01-26 NOTE — Telephone Encounter (Signed)
I called pt.  I asked if he would consider donating his plasma to assist in the recovery of others recovering from the COVID-19.   I explained about the antibodies his body has developed and how giving them to others with the virus could potentially assist their recovery and perhaps help them have less complications. He responded,   "I would like to do that".   "If it will help someone else with the virus".  "I will think about it but I'm almost certain I will do it".  I thanked him  For being willing to consider donating and I was glad he would probably do it. I gave him the web site [facility] with instructions on what to do. He thanked me for calling.

## 2022-04-06 IMAGING — MR COLUNA LOMBAR [HOSPITAL]^ROTINA LOMBAR
5 series · 38 of 48 positions shown · non-contrast
Comparison: none

[Series 2: T2 · coronal · 4.0mm · 0.88mm/px · 8 of 20 slices shown (1 of 3)]
[im 1/20]
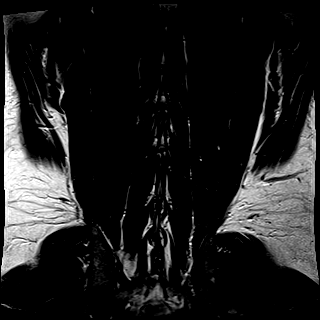
[im 3/20]
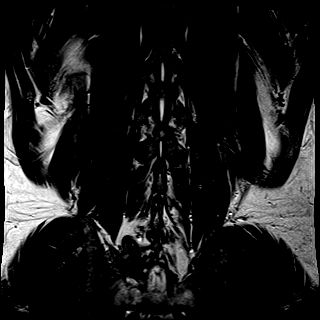
[im 7/20]
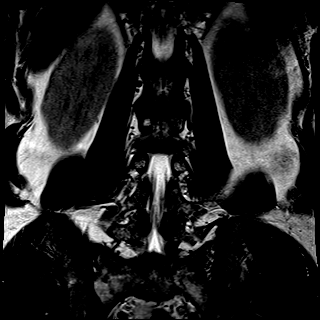
[im 9/20]
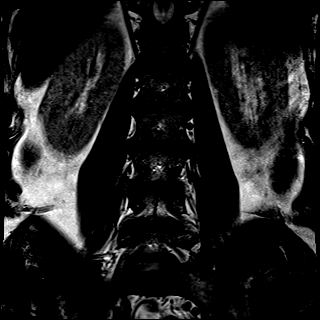
[im 11/20]
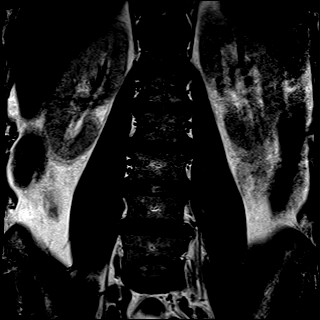
[im 13/20]
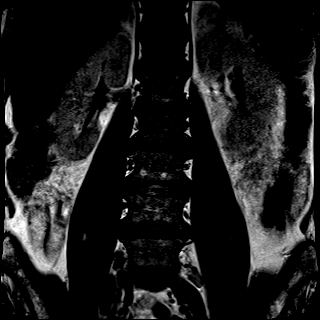
[im 17/20]
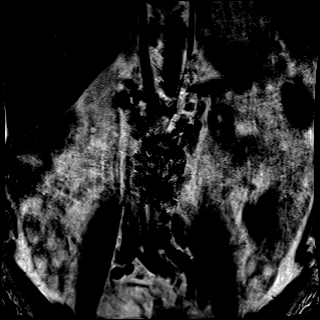
[im 20/20]
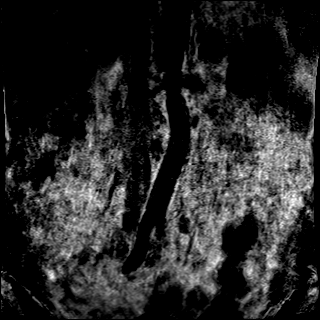

[Series 3: T2 · sagittal · 4.0mm · 0.84mm/px · 8 of 14 slices shown (2 of 3)]
[im 1/14]
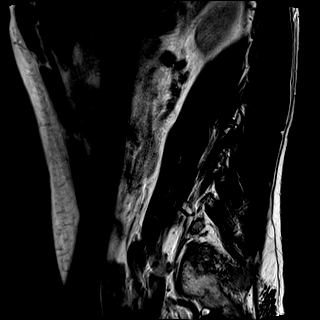
[im 2/14]
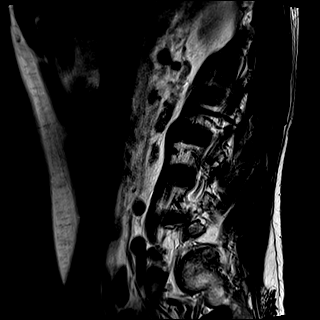
[im 4/14]
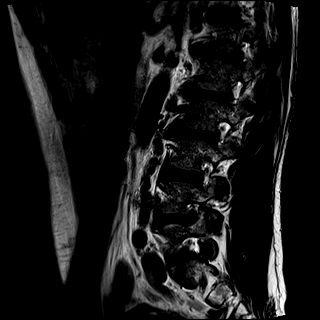
[im 6/14]
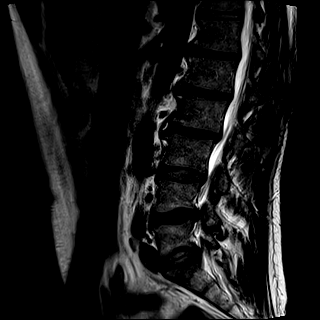
[im 8/14]
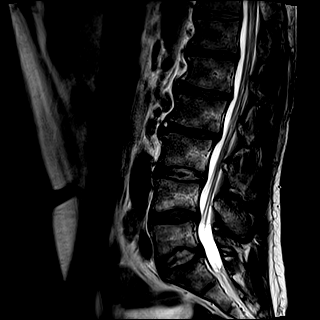
[im 10/14]
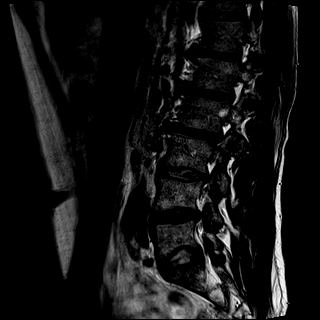
[im 12/14]
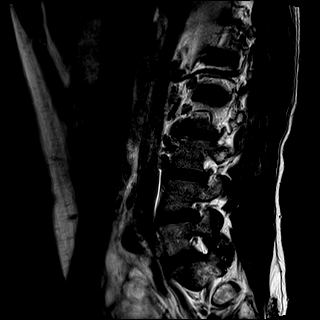
[im 14/14]
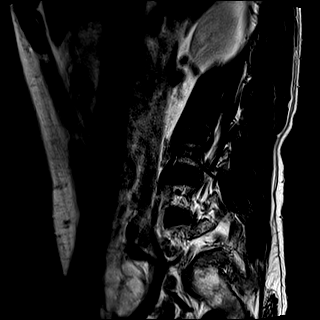

[Series 4: T1 · sagittal · 4.0mm · 0.84mm/px · 8 of 14 slices shown]
[im 1/14]
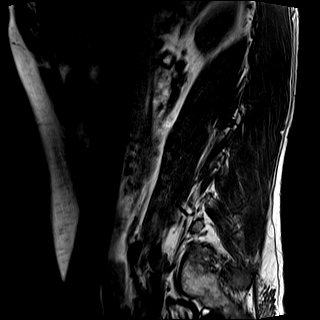
[im 2/14]
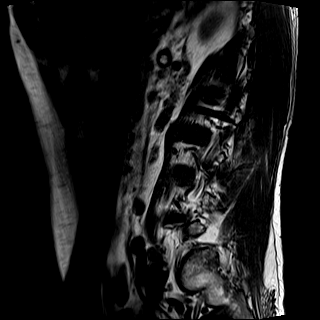
[im 4/14]
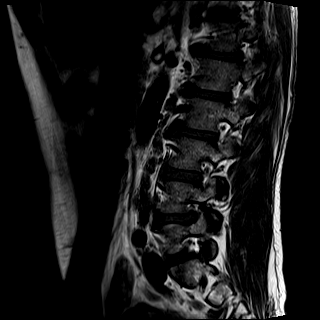
[im 6/14]
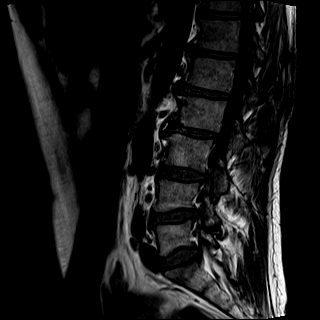
[im 8/14]
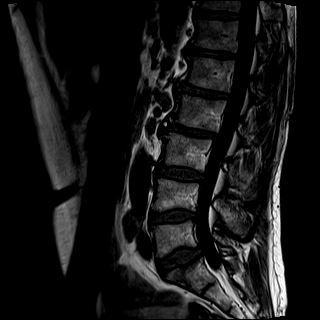
[im 10/14]
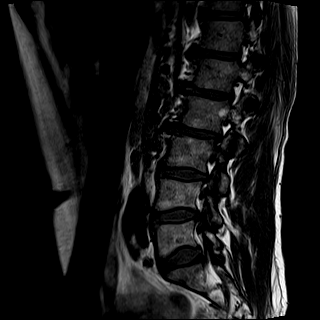
[im 12/14]
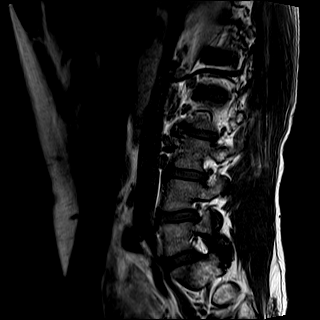
[im 14/14]
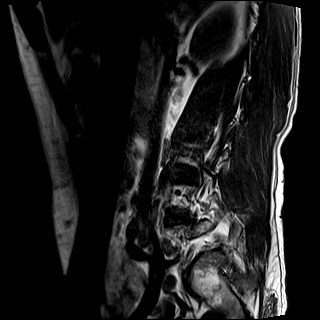

[Series 5: STIR · sagittal · 4.0mm · 0.53mm/px · 6 of 14 slices shown]
[im 1/14]
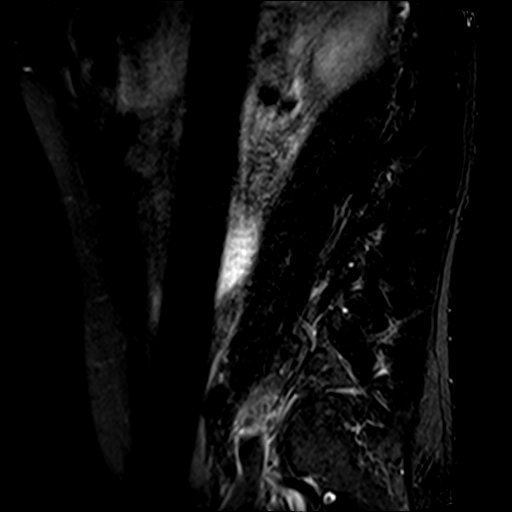
[im 2/14]
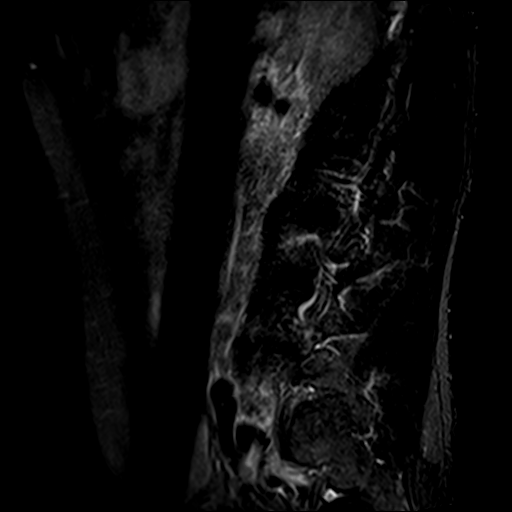
[im 4/14]
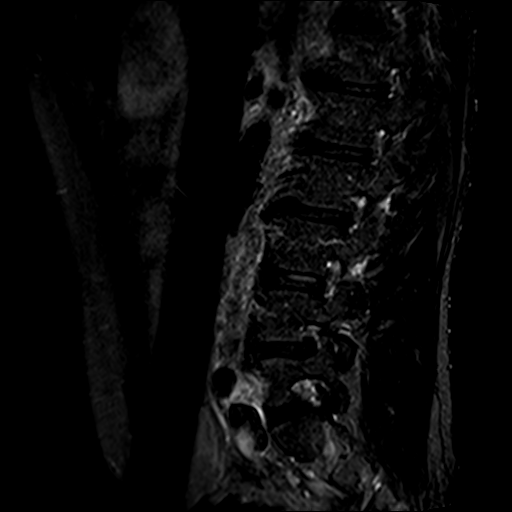
[im 6/14]
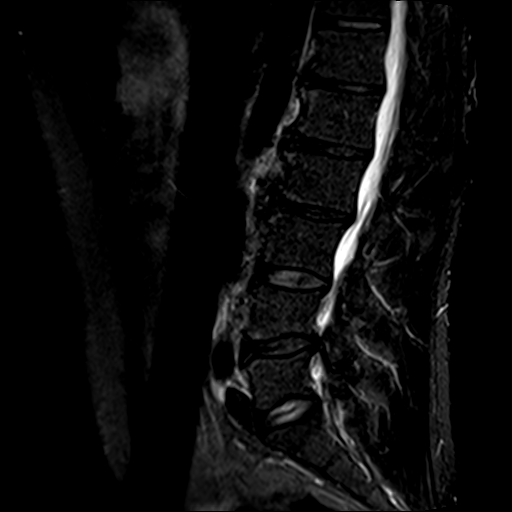
[im 8/14]
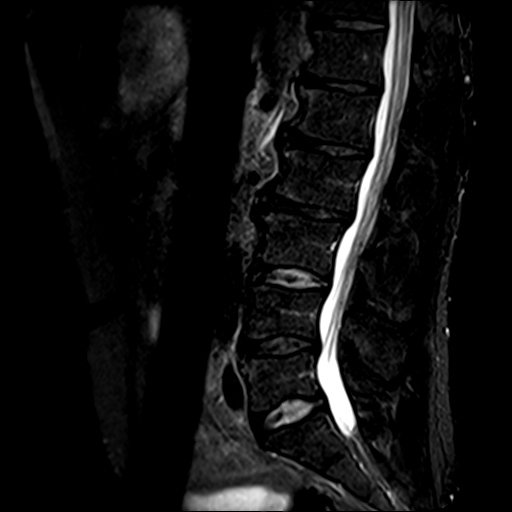
[im 10/14]
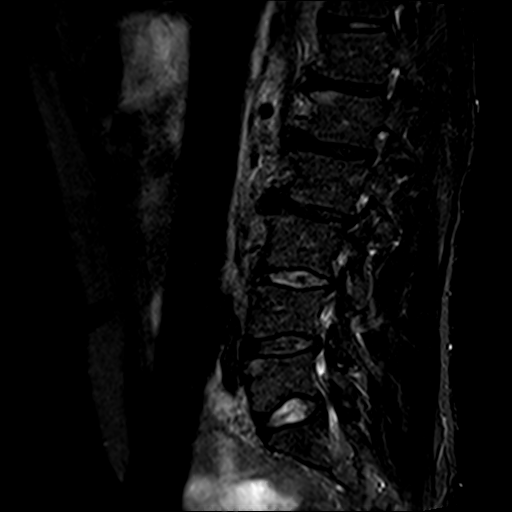

[Series 6: T2 · axial · 4.0mm · 0.78mm/px · z∈[-127,+61]mm · 8 of 26 slices shown (3 of 3)]
[im 1/26]
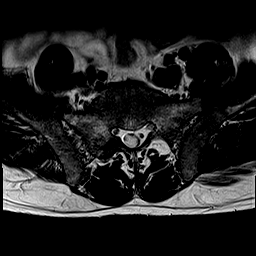
[im 4/26]
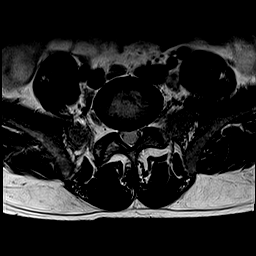
[im 8/26]
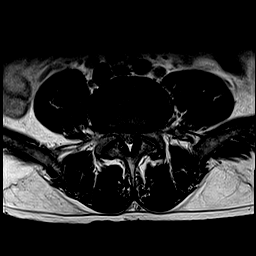
[im 12/26]
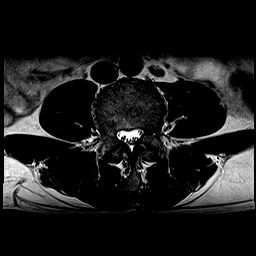
[im 14/26]
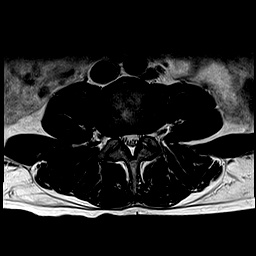
[im 18/26]
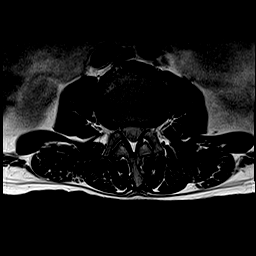
[im 22/26]
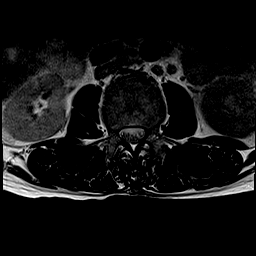
[im 26/26]
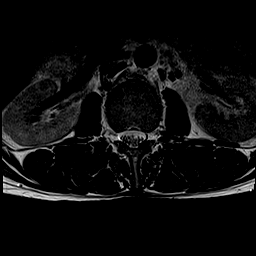

[38 of 48 positions shown; findings below may reference images not displayed]

Leito\Convênio: MUNICIPIO DE LUZ
Técnica:
Exame realizado com sequências ponderadas em T1 e T2, sem a administração intravenosa do agente de
contraste paramagnético.
Relatório:
Retrolistese de L2.
RESSONÂNCIA MAGNÉTICA DA COLUNA LOMBOSSACRA
O canal vertebral exibe dimensões normais por toda extensão avaliada.
Os corpos vertebrais apresentam altura preservada e osteófitos marginais.
Desidratação discal principalmente em T11-T12, T12-L1, L1-L2, L2-L3 e L4-L5.
Abaulamentos discais simétricos em L1-L2, L2-L3 e L3-L4, que tocam o saco dural, sem compressões
radiculares.
Abaulamento discal simétrico em L4-L5, que toca o saco dural e as raízes emergentes de L4.
Artrose interapofisária lombar.
Demais forames de conjugação livres.
O cone medular é tópico, sendo de calibre e intensidade de sinal normais.
Raízes nervosas da cauda equina de morfologia e distribuição anatômica.
Impressão:
Retrolistese de L2.
Osteofitose marginal.
Desidratação discal principalmente em T11-T12, T12-L1, L1-L2, L2-L3 e L4-L5.
Leito\Convênio: MUNICIPIO DE LUZ
Abaulamentos discais simétricos em L1-L2, L2-L3 e L3-L4, que tocam o saco dural, sem compressões
radiculares.
Abaulamento discal simétrico em L4-L5, que toca o saco dural e as raízes emergentes de L4.
Artrose interapofisária lombar.

## 2023-03-24 IMAGING — MR NEURO [HOSPITAL]^CRANIO
12 of 13 series · 34 of 48 positions shown · non-contrast
Comparison: none

[Series 2: T1 · sagittal · 4.5mm · 1.00mm/px · 4 of 31 slices shown (1 of 3)]
[im 1/31]
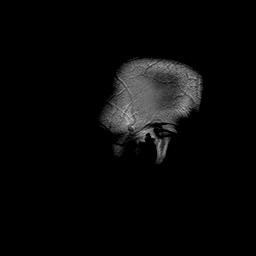
[im 11/31]
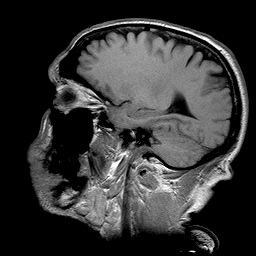
[im 21/31]
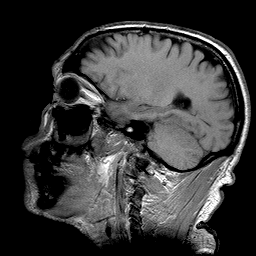
[im 31/31]
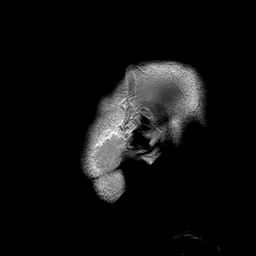

[Series 3: T2 · sagittal · 4.5mm · 1.00mm/px · 3 of 31 slices shown (1 of 6)]
[im 1/31]
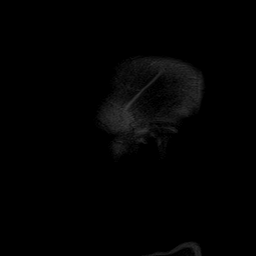
[im 16/31]
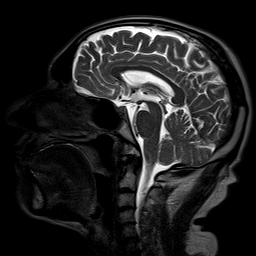
[im 31/31]
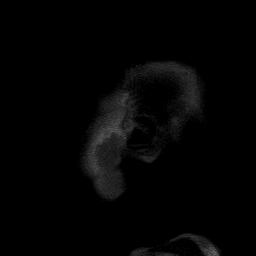

[Series 4: T2 · axial · 4.5mm · 1.00mm/px · z∈[-18,+130]mm · 2 of 27 slices shown (2 of 6)]
[im 1/27]
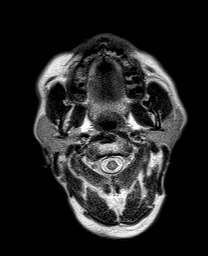
[im 27/27]
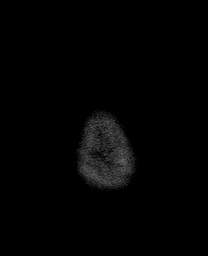

[Series 5: FLAIR · axial · 4.5mm · 1.00mm/px · z∈[-19,+129]mm · 2 of 27 slices shown]
[im 1/27]
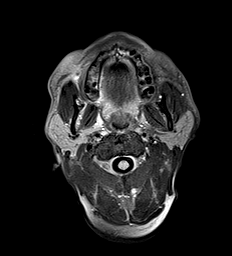
[im 27/27]
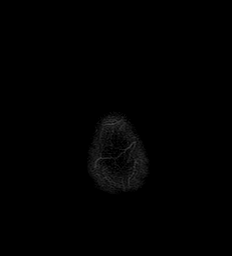

[Series 6: T2 · axial · 4.5mm · 0.50mm/px · z∈[-18,+129]mm · 2 of 27 slices shown (3 of 6)]
[im 1/27]
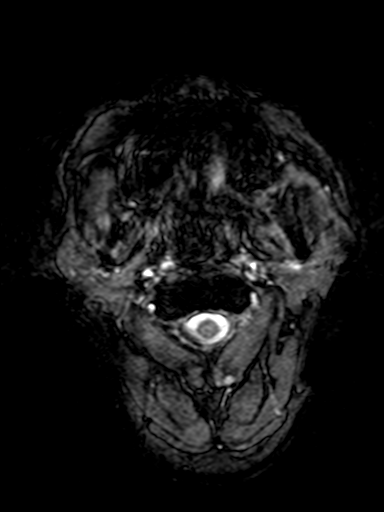
[im 27/27]
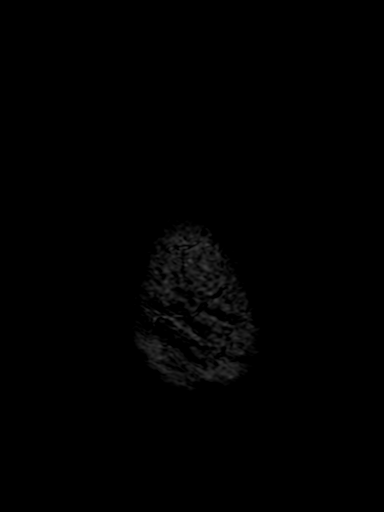

[Series 7: axial difusao_tracew · axial · 4.5mm · 1.45mm/px · z∈[-19,+128]mm · 5 of 54 slices shown]
[im 1/54]
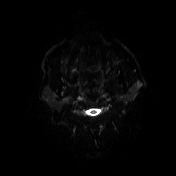
[im 14/54]
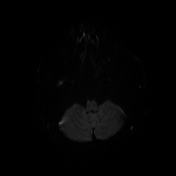
[im 27/54]
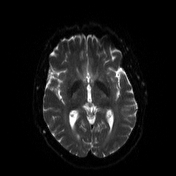
[im 40/54]
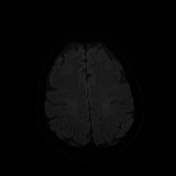
[im 54/54]
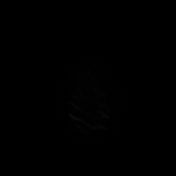

[Series 8: axial difusao_adc · axial · 4.5mm · 1.45mm/px · z∈[-19,+128]mm · 2 of 27 slices shown]
[im 1/27]
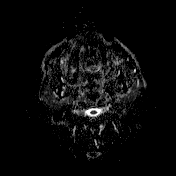
[im 27/27]
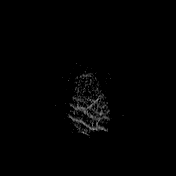

[Series 9: T2 · coronal · 4.5mm · 0.94mm/px · 3 of 31 slices shown (4 of 6)]
[im 1/31]
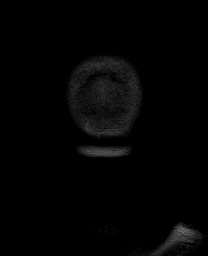
[im 16/31]
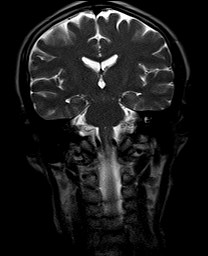
[im 31/31]
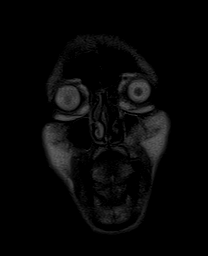

[Series 10: T1 · axial · non-contrast · 4.5mm · 0.94mm/px · z∈[-21,+126]mm · 2 of 27 slices shown (2 of 3)]
[im 1/27]
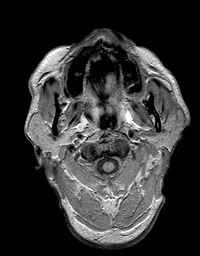
[im 27/27]
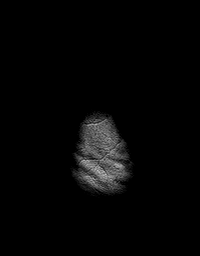

[Series 11: T2 · axial · 4.0mm · 0.80mm/px · z∈[-8,+130]mm · 5 of 60 slices shown (5 of 6)]
[im 1/60]
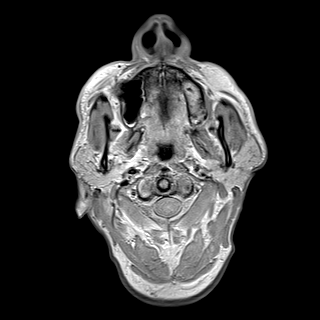
[im 15/60]
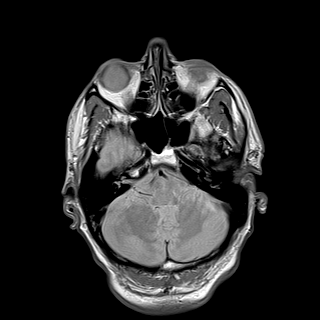
[im 30/60]
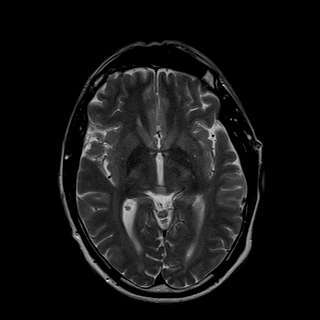
[im 45/60]
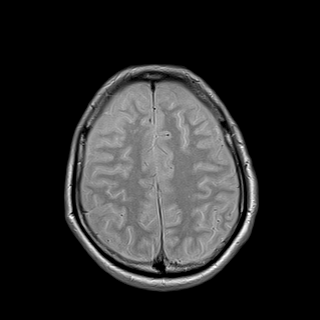
[im 60/60]
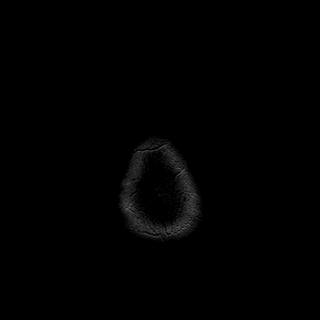

[Series 12: T2 · axial · 4.5mm · 0.50mm/px · z∈[-18,+129]mm · 2 of 27 slices shown (6 of 6)]
[im 1/27]
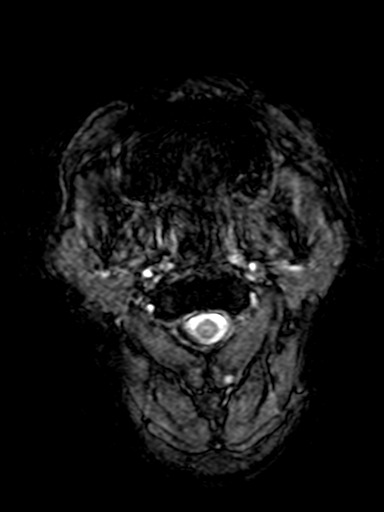
[im 27/27]
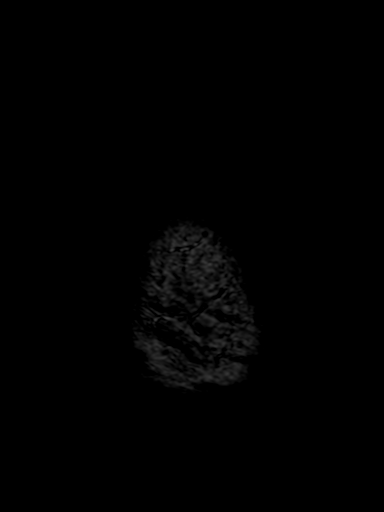

[Series 13: T1 · axial · 4.5mm · 0.94mm/px · z∈[-21,+126]mm · 2 of 27 slices shown (3 of 3)]
[im 1/27]
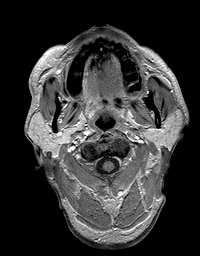
[im 27/27]
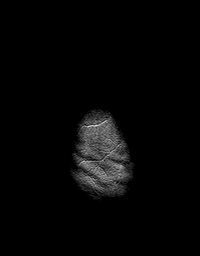

[34 of 48 positions shown; findings below may reference images not displayed]

Leito\Convênio: MUNICIPIO DE LUZ
METODOLOGIA:
Exame realizado com sequências SE (spin-echo), FSE (fast spin-eco), GR (gradiente-eco) e FSE-IR (FLAIR),
em planos de cortes múltiplos, antes e após a administração intravenosa do agente de contraste
paramagnético.
ANÁLISE:
Não há evidência de processo expansivo intracraniano, hemorragia intraparenquimatosa aguda, isquemia
aguda/subaguda, bem como de coleções líquidas extra-axiais acima ou abaixo do tentório.
RESSONÂNCIA MAGNÉTICA DO ENCÉFALO
O sistema ventricular é de topografia, morfologia e dimensões normais.
Áreas hiperintensas em T2/FLAIR comprometendo a substância branca periventricular, subcortical e do centro
semioval e ambos os hemisférios cerebrais, relacionadas a fenômenos isquêmicos decorrentes de doença dos
pequenos vasos (microangiopatia). 
Não se evidenciam áreas com impregnação anômala pelo gadolínio.
Não se identificam áreas com restrição à difusão da água na sequência ECOPLANAR.
Fluxo habitual nas grandes artérias dos sistemas vertebrobasilar e carotídeo, segundo o critério Spin-Echo.
Cavidades aéreas paranasais com intensidade de sinal normal.
IMPRESSÃO:
Sinais de discreta microangiopatia supratentorial.

## 2023-06-16 IMAGING — CT ////
1 of 2 series · 14 of 32 positions shown, 18 images · non-contrast
Comparison: none

[Series 2: mediastino · axial · 0.87mm/px · z∈[-324,-57]mm · 14 of 107 slices shown, 18 images]
[im 9/107  mediastinal]
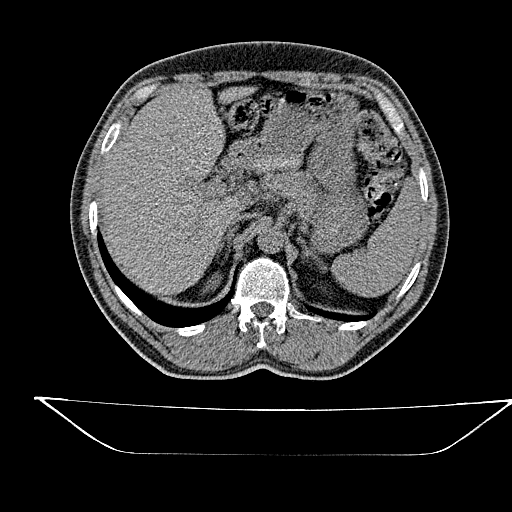
[im 9/107  lung]
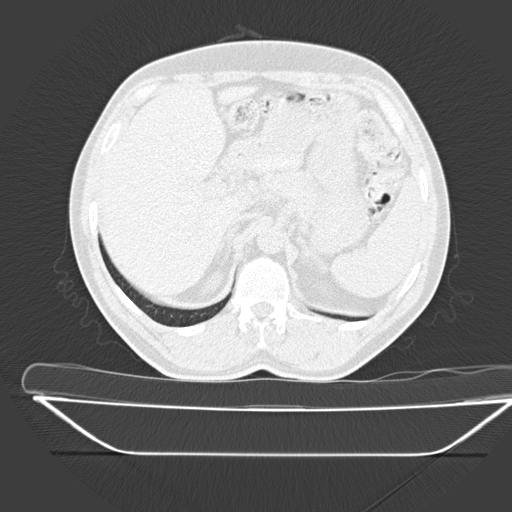
[im 17/107  lung]
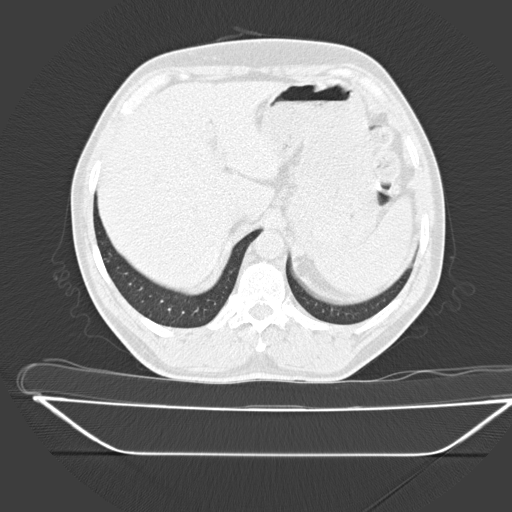
[im 25/107  lung]
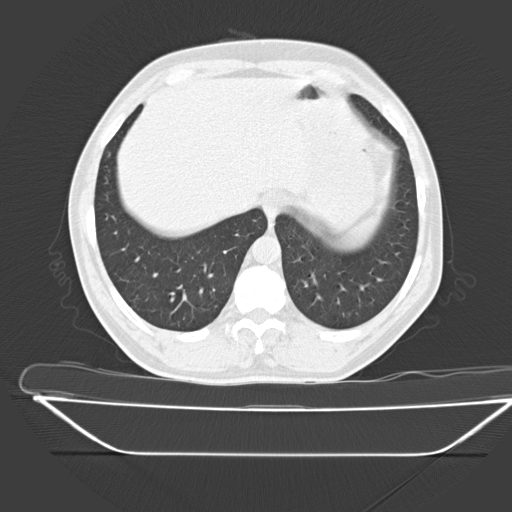
[im 33/107  lung]
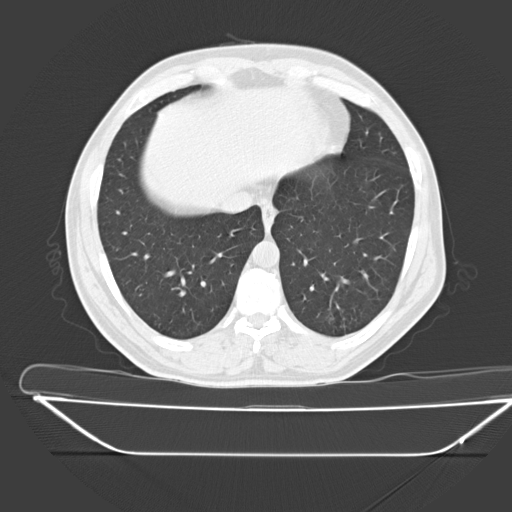
[im 41/107  mediastinal]
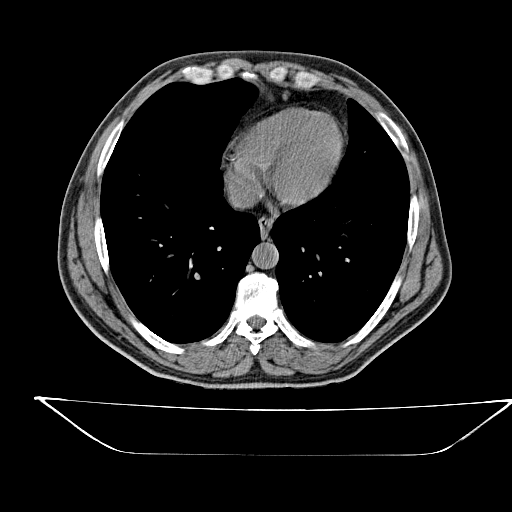
[im 41/107  lung]
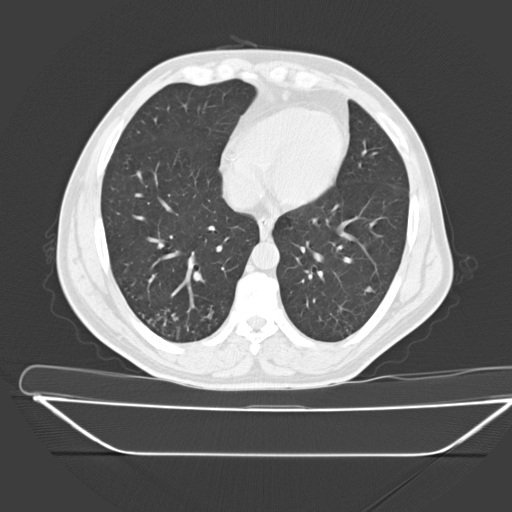
[im 49/107  lung]
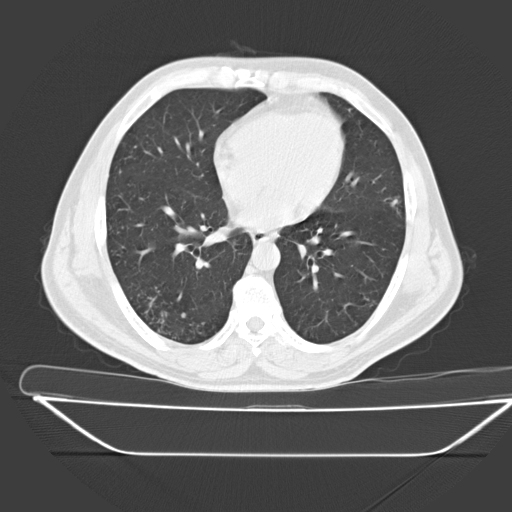
[im 51/107  lung]
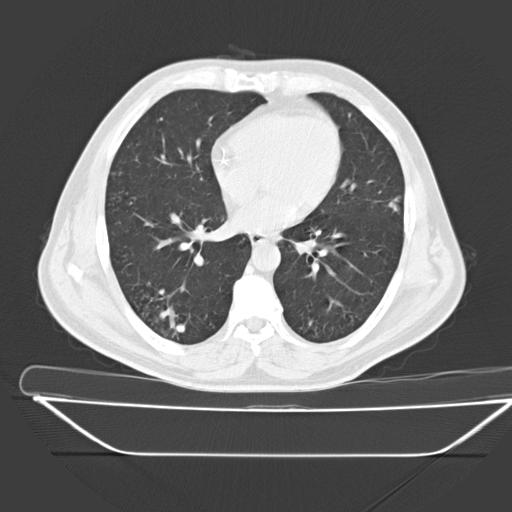
[im 54/107  lung]
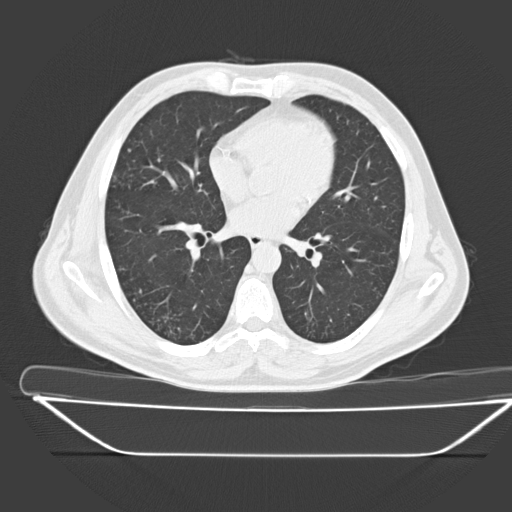
[im 58/107  mediastinal]
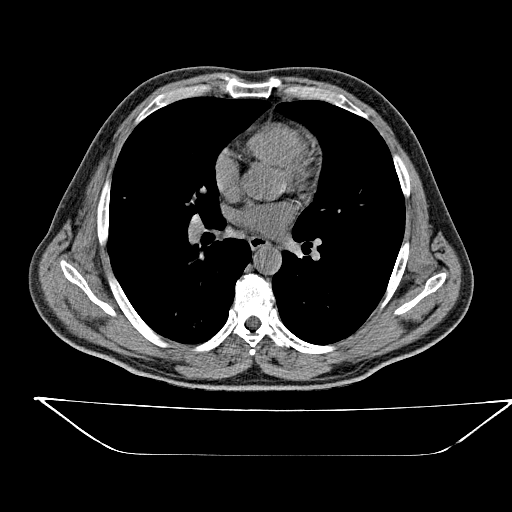
[im 58/107  lung]
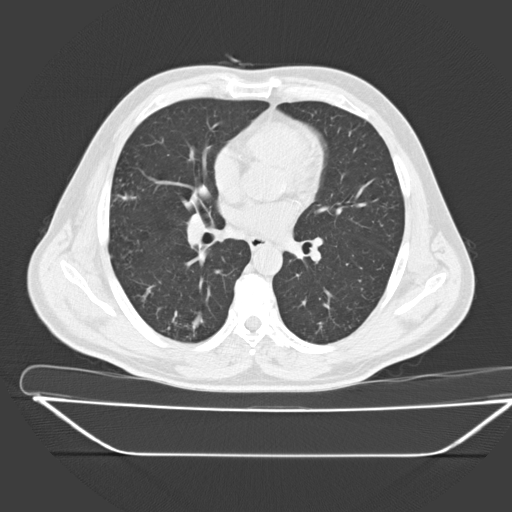
[im 66/107  lung]
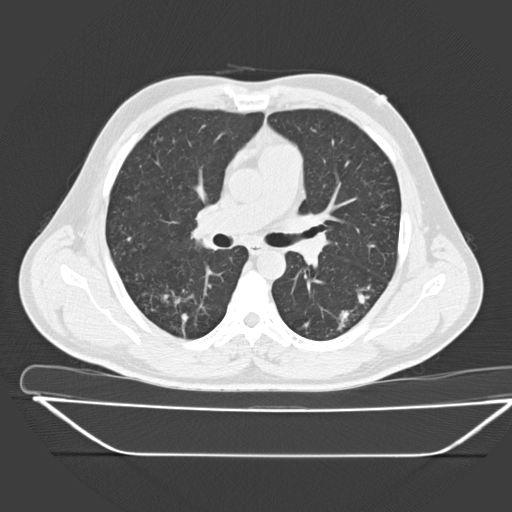
[im 74/107  lung]
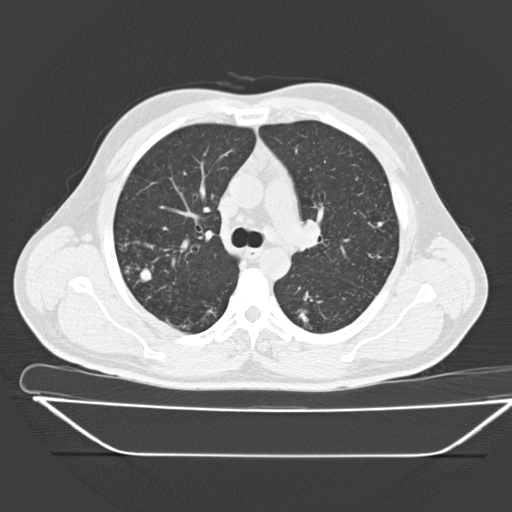
[im 82/107  lung]
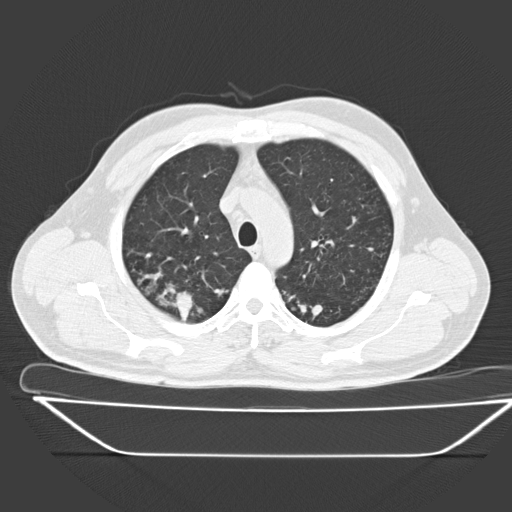
[im 90/107  mediastinal]
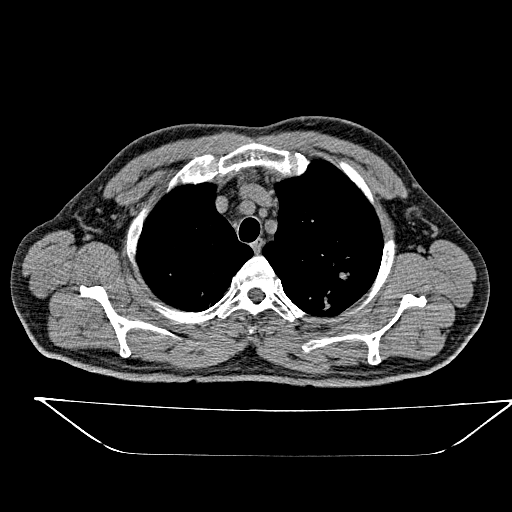
[im 90/107  lung]
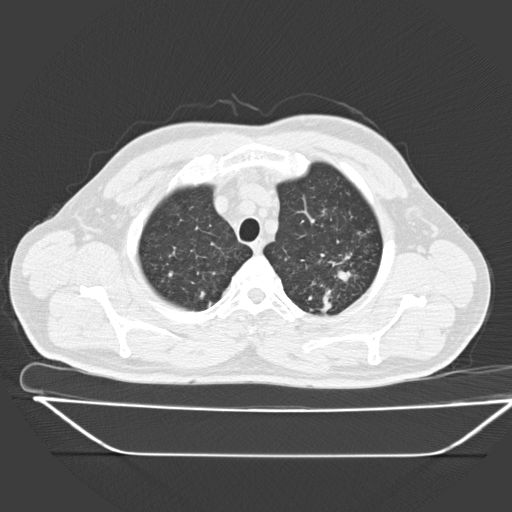
[im 98/107  lung]
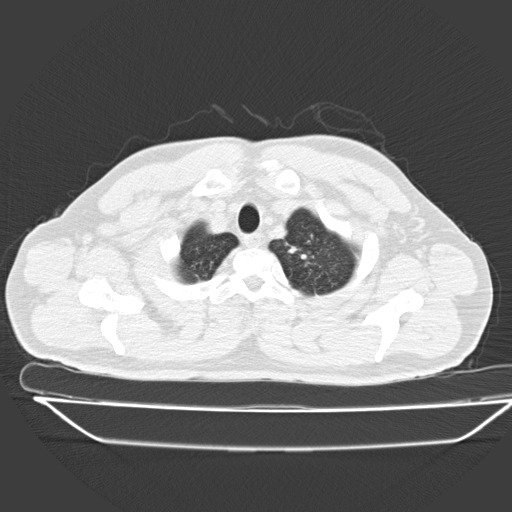

[14 of 32 positions shown; findings below may reference images not displayed]

Técnica:
O exame foi realizado com tomografia computadorizada (TC) do tórax, em cortes transversais finos (1-5
mm), sem a administração de contraste intravenoso. O paciente foi posicionado em decúbito dorsal, e as
imagens foram adquiridas em diferentes planos, permitindo a avaliação detalhada das estruturas anatômicas
torácicas.
TOMOGRAFIA COMPUTADORIZADA DO TÓRAX
Indicação Clínica: Controle de tuberculose miliar.
Achados:
Pulmões: Observa-se a presença de imagens em "árvore em brotamento" e opacidades centrolobulares
esparsas em ambos os parênquimas pulmonares, sugestivas de bronquiolopatias. Além disso, são observados
nódulos com atenuação de partes moles, que podem corresponder à confluência das opacidades
centrolobulares, não se descartando a possibilidade de outras naturezas, como implantes secundários. O
maior nódulo está localizado em campo posterior do lobo superior direito, medindo 1,7 cm.
Alterações enfisematosas, centrolobulares e paraceptais predominam em ambos os lobos superiores.
Mediastino: Sem linfadenomegalias mediastinais observáveis. As estruturas vasculares são visualizadas sem
alterações significativas.
Pleura: Não há derrames pleurais ou espessamentos pleurais.
Pericárdio: Não se observa efusão pericárdica.
Consolidações: Não há evidência de consolidações parenquimatosas ou atenuações em vidro fosco que
indiquem processo broncopneumônico em atividade.
Vasos Pulmonares: Os vasos pulmonares estão normais, com diâmetros adequados e sem sinais de
anormalidades.
Coração: O coração apresenta-se em posição normal, sem aumento de cavidades.
Tárcio Bachmann
Estruturas Ósseas: As estruturas ósseas torácicas, incluindo costelas e esterno, estão íntegras, sem fraturas
ou lesões osteolíticas.
Conclusão:
A tomografia computadorizada do tórax demonstra imagens sugestivas de bronquiolopatias, com a presença
de árvore em brotamento e opacidades centrolobulares. Foram identificadas alterações enfisematosas,
centrolobulares e paraceptais predominando em ambos os lobos superiores. Não há evidência de
linfadenomegalias mediastinais, efusão pericárdica ou derrames pleurais, e as consolidações parenquimatosas
e atenuações em vidro fosco não estão presentes, indicando ausência de processo broncopneumônico em
atividade. É aconselhável manter controle, principalmente das formações nodulares supra citadas.
Tárcio Bachmann
